# Patient Record
Sex: Male | Born: 1935 | Race: White | Hispanic: No | Marital: Married | Smoking: Former smoker
Health system: Southern US, Community
[De-identification: ages and names within clinical notes are randomized; demographics above are authoritative.]

## PROBLEM LIST (undated history)

## (undated) DIAGNOSIS — I10 Essential (primary) hypertension: Secondary | ICD-10-CM

## (undated) DIAGNOSIS — I2699 Other pulmonary embolism without acute cor pulmonale: Secondary | ICD-10-CM

## (undated) DIAGNOSIS — I4510 Unspecified right bundle-branch block: Secondary | ICD-10-CM

## (undated) DIAGNOSIS — D689 Coagulation defect, unspecified: Secondary | ICD-10-CM

## (undated) HISTORY — DX: Unspecified right bundle-branch block: I45.10

## (undated) HISTORY — DX: Other pulmonary embolism without acute cor pulmonale: I26.99

## (undated) HISTORY — DX: Coagulation defect, unspecified: D68.9

## (undated) HISTORY — DX: Essential (primary) hypertension: I10

---

## 2018-05-18 ENCOUNTER — Encounter: Payer: Self-pay | Admitting: Neurology

## 2018-07-16 ENCOUNTER — Ambulatory Visit (INDEPENDENT_AMBULATORY_CARE_PROVIDER_SITE_OTHER): Payer: Medicare Other | Admitting: Neurology

## 2018-07-16 ENCOUNTER — Other Ambulatory Visit: Payer: Self-pay

## 2018-07-16 ENCOUNTER — Encounter: Payer: Self-pay | Admitting: Neurology

## 2018-07-16 ENCOUNTER — Telehealth: Payer: Self-pay

## 2018-07-16 ENCOUNTER — Encounter

## 2018-07-16 VITALS — BP 144/72 | HR 62 | Ht 69.0 in | Wt 209.0 lb

## 2018-07-16 DIAGNOSIS — Z8673 Personal history of transient ischemic attack (TIA), and cerebral infarction without residual deficits: Secondary | ICD-10-CM

## 2018-07-16 DIAGNOSIS — R55 Syncope and collapse: Secondary | ICD-10-CM
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
## 2018-07-16 NOTE — Telephone Encounter (Signed)
Called pt's cardiologist, Dr Norris Crossurner's office to see if his office can preform the CAROTID ULTRASOUND and 30 DAY HOLTER MONITOR that Dr. Karel JarvisAquino is requesting pt to have done.    Per office voicemail, the office is closed on Friday's.  LMOM asking for return call.
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
## 2018-07-19 ENCOUNTER — Telehealth: Payer: Self-pay

## 2018-07-19 NOTE — Telephone Encounter (Signed)
Received VM from pt stating that his MRI has been scheduled with [facility] for Saturday, December 7 @ 2:40.  Looking in pt's chart, we were hoping to have MRI and EEG done on same day since pt lives in [place].  Called [facility] and spoke with Pieter PartridgeMiriam, who rescheduled MRI for Monday, December 9 @ 1:00pm, I then scheduled pt's 1 HR routine EEG for Monday, December 9 @ 2:30PM.    Called and advised pt of appointment change as well as EEG appointment information.

## 2018-07-20 NOTE — Telephone Encounter (Signed)
Called Dr. Tasia Catchingsraig Turner's office again in regard to message below.  Per VM Dr. [place]ford [place] office [place]uesdays.  Called [place] office, no answer, LMOM asking for return call.   Dr. Norris Crossurner's office contacts [place]: [phone](Tuesdays)  [facility]: [phone] (M, W, Th)

## 2018-07-28 ENCOUNTER — Telehealth: Payer: Self-pay | Admitting: Neurology

## 2018-07-28 DIAGNOSIS — Z8673 Personal history of transient ischemic attack (TIA), and cerebral infarction without residual deficits: Secondary | ICD-10-CM

## 2018-07-28 DIAGNOSIS — R55 Syncope and collapse: Secondary | ICD-10-CM

## 2018-07-28 NOTE — Telephone Encounter (Signed)
Patient is calling from [phone]. Asking about the Heart Ultrasound and Holter Monitor for Dr.Turner that was supposed to be set up. Please call him back.Thanks!

## 2018-07-30 NOTE — Telephone Encounter (Signed)
Per VM left by pt, Dr. Malachy Moodurners office does not do holter monitors or carotid US.  (pls see previous phone encounter)  Orders placed for both locally.

## 2018-07-31 ENCOUNTER — Other Ambulatory Visit: Payer: Medicare Other

## 2018-08-02 ENCOUNTER — Ambulatory Visit (INDEPENDENT_AMBULATORY_CARE_PROVIDER_SITE_OTHER): Payer: Medicare Other | Admitting: Neurology

## 2018-08-02 ENCOUNTER — Ambulatory Visit
Admission: RE | Admit: 2018-08-02 | Discharge: 2018-08-02 | Disposition: A | Discharge status: Home / Self Care | Payer: Medicare Other | Source: Ambulatory Visit | Attending: Neurology | Admitting: Neurology

## 2018-08-02 DIAGNOSIS — R55 Syncope and collapse: Secondary | ICD-10-CM | POA: Diagnosis not present

## 2018-08-02 DIAGNOSIS — Z8673 Personal history of transient ischemic attack (TIA), and cerebral infarction without residual deficits: Secondary | ICD-10-CM

## 2018-08-02 MED ORDER — GADOBENATE DIMEGLUMINE 529 MG/ML IV SOLN
20.0000 mL | Freq: Once | INTRAVENOUS | Status: AC | PRN
  Administered 2018-08-02: 20 mL via INTRAVENOUS

## 2018-08-05 ENCOUNTER — Ambulatory Visit (INDEPENDENT_AMBULATORY_CARE_PROVIDER_SITE_OTHER): Payer: Medicare Other

## 2018-08-05 DIAGNOSIS — Z8673 Personal history of transient ischemic attack (TIA), and cerebral infarction without residual deficits: Secondary | ICD-10-CM

## 2018-08-05 DIAGNOSIS — R55 Syncope and collapse: Secondary | ICD-10-CM
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
## 2018-08-10 ENCOUNTER — Ambulatory Visit
Admission: RE | Admit: 2018-08-10 | Discharge: 2018-08-10 | Disposition: A | Discharge status: Home / Self Care | Payer: Medicare Other | Source: Ambulatory Visit | Attending: Neurology | Admitting: Neurology

## 2018-08-10 DIAGNOSIS — R55 Syncope and collapse: Secondary | ICD-10-CM

## 2018-08-10 DIAGNOSIS — Z8673 Personal history of transient ischemic attack (TIA), and cerebral infarction without residual deficits: Secondary | ICD-10-CM

## 2018-08-12 ENCOUNTER — Telehealth: Payer: Self-pay

## 2018-08-12 NOTE — Telephone Encounter (Signed)
Spoke with pt relaying message below.  He states that he is currently wearing Holter monitor - it is scheduled to come off Jan 11.  [facility] bilateral carotid ultrasound on Tuesday, December 24

## 2018-08-12 NOTE — Telephone Encounter (Signed)
-----   Message from Van ClinesKaren M Aquino, MD sent at 08/05/2018 12:38 PM EST ----- Pls let him know the brain wave test was normal. The MRI brain showed the old strokes, no new stroke. It showed age-related changes. Proceed with carotid ultrasound and 30-day holter as discussed. Thanks

## 2018-08-13 ENCOUNTER — Telehealth: Payer: Self-pay | Admitting: *Deleted
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
## 2018-08-20 ENCOUNTER — Encounter: Payer: Self-pay | Admitting: Internal Medicine

## 2018-08-23 ENCOUNTER — Telehealth: Payer: Self-pay

## 2018-08-23 NOTE — Telephone Encounter (Signed)
Received [employer] monitor that the patient had 8 beats of V tach and 3 PVCs in 1 minute. Spoke with Dr. Eden EmmsNishan, he reviewed and did not have recommendations, other than continue to monitor. The patient was asymptomatic during the time and he accepted to call our office if he felt any symptoms.

## 2018-09-07 ENCOUNTER — Telehealth: Payer: Self-pay | Admitting: Neurology

## 2018-09-07 NOTE — Telephone Encounter (Signed)
Patient is calling in about needing results of an ultrasound. Please call him back at [phone]. Thanks!

## 2018-09-07 NOTE — Telephone Encounter (Signed)
Not sure why he did not get results the last time, pls let him know there was no significant narrowing, but there is moderate plaque build up, so it is very important to control cholesterol levels. His heart monitor showed a period of abnormal rhythm. With the passing out episode and this abnormal heart monitor result, recommend he see Cardiology. Thanks

## 2018-09-07 NOTE — Telephone Encounter (Signed)
Spoke with pt relaying message below.  He states that the cardiologist that preformed the heart monitor has placed a referral to Electrophysiology - Dr Sharrell Ku.  His appointment with Dr. Ladona Ridgel is scheduled for 09/17/2018

## 2018-09-07 NOTE — Telephone Encounter (Signed)
US was done 08/10/18.  pls advise

## 2018-09-17 ENCOUNTER — Encounter: Payer: Self-pay | Admitting: Internal Medicine

## 2018-09-17 ENCOUNTER — Ambulatory Visit (INDEPENDENT_AMBULATORY_CARE_PROVIDER_SITE_OTHER): Payer: Medicare Other | Admitting: Internal Medicine

## 2018-09-17 VITALS — BP 138/82 | HR 64 | Ht 69.0 in | Wt 215.4 lb

## 2018-09-17 DIAGNOSIS — R55 Syncope and collapse: Secondary | ICD-10-CM | POA: Diagnosis not present
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
## 2018-09-17 NOTE — Patient Instructions (Addendum)
Medication Instructions:  Your physician recommends that you continue on your current medications as directed. Please refer to the Current Medication list given to you today.  Labwork: None ordered.  Testing/Procedures: An implantable loop recorder is a small electronic device that is placed under the skin of your chest. It is about the size of an AA ("double A") battery. The device records the electrical activity of your heart over a long period of time. Your health care provider can download these recordings to monitor your heart. You may need an implantable loop recorder if you have periods of abnormal heart activity (arrhythmias) or unexplained fainting (syncope). The recorder can be left in place for 1 year or longer.  You are scheduled for a loop implant on September 22, 2018 at 1:30 pm.  [facility] main entrance and proceed to Admitting.  There are no special instructions-you may eat and drink and take your normal morning medications.  Follow-Up:  Your physician wants you to follow-up in: 7-10 days for a wound check with our device clinic.  Your physician wants you to follow-up in: 6 months with Dr. Ladona Ridgelaylor.   You will receive a reminder letter in the mail two months in advance. If you don't receive a letter, please call our office to schedule the follow-up appointment.  Any Other Special Instructions Will Be Listed Below (If Applicable).  If you need a refill on your cardiac medications before your next appointment, please call your pharmacy.     Implantable Loop Recorder Placement  An implantable loop recorder is a small electronic device that is placed under the skin of your chest. It is about the size of an AA ("double A") battery. The device records the electrical activity of your heart over a long period of time. Your health care provider can download these recordings to monitor your heart. You may need an implantable loop recorder if you have periods  of abnormal heart activity (arrhythmias) or unexplained fainting (syncope). The recorder can be left in place for 1 year or longer. Tell a health care provider about:  Any allergies you have.  All medicines you are taking, including vitamins, herbs, eye drops, creams, and over-the-counter medicines.  Any problems you or family members have had with anesthetic medicines.  Any blood disorders you have.  Any surgeries you have had.  Any medical conditions you have.  Whether you are pregnant or may be pregnant. What are the risks? Generally, this is a safe procedure. However, problems may occur, including:  Infection.  Bleeding.  Allergic reactions to anesthetic medicines.  Damage to nerves or blood vessels.  Failure of the device to work. This could require another surgery to replace it. What happens before the procedure?   You may have a physical exam, blood tests, and imaging tests of your heart, such as a chest X-ray.  Follow instructions from your health care provider about eating or drinking restrictions.  Ask your health care provider about: ? Changing or stopping your regular medicines. This is especially important if you are taking diabetes medicines or blood thinners. ? Taking medicines such as aspirin and ibuprofen. These medicines can thin your blood. Do not take these medicines unless your health care provider tells you to take them. ? Taking over-the-counter medicines, vitamins, herbs, and supplements.  Ask your health care provider how your surgical site will be marked or identified.  Ask your health care provider what steps will be taken to help prevent infection.  These may include: ? Removing hair at the surgery site. ? Washing skin with a germ-killing soap.  Plan to have someone take you home from the hospital or clinic.  Plan to have a responsible adult care for you for at least 24 hours after you leave the hospital or clinic. This is important.  Do  not use any products that contain nicotine or tobacco, such as cigarettes and e-cigarettes. If you need help quitting, ask your health care provider. What happens during the procedure?  An IV will be inserted into one of your veins.  You may be given one or more of the following: ? A medicine to help you relax (sedative). ? A medicine to numb the area (local anesthetic).  A small incision will be made on the left side of your upper chest.  A pocket will be created under your skin.  The device will be placed in the pocket.  The incision will be closed with stitches (sutures) or adhesive strips.  A bandage (dressing) will be placed over the incision. The procedure may vary among health care providers and hospitals. What happens after the procedure?  Your blood pressure, heart rate, breathing rate, and blood oxygen level will be monitored until you leave the hospital or clinic.  You may be able to go home on the day of your surgery. Before you go home: ? Your health care provider will program your recorder. ? You will learn how to trigger your device with a handheld activator. ? You will learn how to send recordings to your health care provider. ? You will get an ID card for your device, and you will be told when to use it.  Do not drive for 24 hours if you were given a sedative during your procedure. Summary  An implantable loop recorder is a small electronic device that is placed under the skin of your chest to monitor your heart over a long period of time.  The recorder can be left in place for 1 year or longer.  Plan to have someone take you home from the hospital or clinic. This information is not intended to replace advice given to you by your health care provider. Make sure you discuss any questions you have with your health care provider. Document Released: 07/23/2015 Document Revised: 09/26/2017 Document Reviewed: 09/26/2017 Elsevier Interactive Patient Education  2019  [employer]
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
## 2018-09-22 ENCOUNTER — Ambulatory Visit (HOSPITAL_COMMUNITY)
Admission: RE | Admit: 2018-09-22 | Discharge: 2018-09-22 | Disposition: A | Discharge status: Home / Self Care | Payer: Medicare Other | Attending: Internal Medicine | Admitting: Internal Medicine

## 2018-09-22 ENCOUNTER — Other Ambulatory Visit: Payer: Self-pay

## 2018-09-22 ENCOUNTER — Encounter (HOSPITAL_COMMUNITY)
Admission: RE | Disposition: A | Discharge status: Home / Self Care | Payer: Self-pay | Source: Home / Self Care | Attending: Internal Medicine

## 2018-09-22 DIAGNOSIS — Z87891 Personal history of nicotine dependence: Secondary | ICD-10-CM | POA: Diagnosis not present

## 2018-09-22 DIAGNOSIS — I10 Essential (primary) hypertension: Secondary | ICD-10-CM | POA: Diagnosis not present

## 2018-09-22 DIAGNOSIS — Z86711 Personal history of pulmonary embolism: Secondary | ICD-10-CM | POA: Insufficient documentation

## 2018-09-22 DIAGNOSIS — R001 Bradycardia, unspecified: Secondary | ICD-10-CM | POA: Insufficient documentation

## 2018-09-22 DIAGNOSIS — R55 Syncope and collapse: Secondary | ICD-10-CM | POA: Insufficient documentation

## 2018-09-22 HISTORY — PX: LOOP RECORDER INSERTION: EP1214

## 2018-09-22 SURGERY — LOOP RECORDER INSERTION

## 2018-09-22 MED ORDER — LIDOCAINE-EPINEPHRINE 1 %-1:100000 IJ SOLN
INTRAMUSCULAR | Status: AC
  Filled 2018-09-22: qty 1

## 2018-09-22 MED ORDER — LIDOCAINE-EPINEPHRINE 1 %-1:100000 IJ SOLN
INTRAMUSCULAR | Status: DC | PRN
  Administered 2018-09-22: 10 mL

## 2018-09-22 SURGICAL SUPPLY — 2 items
LOOP REVEAL LINQSYS (Prosthesis & Implant Heart) ×2 IMPLANT
PACK LOOP INSERTION (CUSTOM PROCEDURE TRAY) ×5 IMPLANT

## 2018-09-22 NOTE — Discharge Instructions (Signed)
SEE EXTRA DC PAPER FOR LOOP INSERTION GIVEN TO PT

## 2018-09-22 NOTE — Interval H&P Note (Signed)
History and Physical Interval Note:  09/22/2018 4:08 PM  Kristopher Adams  has presented today for surgery, with the diagnosis of Syncope  The various methods of treatment have been discussed with the patient and family. After consideration of risks, benefits and other options for treatment, the patient has consented to  Procedure(s): LOOP RECORDER INSERTION (N/A) as a surgical intervention .  The patient's history has been reviewed, patient examined, no change in status, stable for surgery.  I have reviewed the patient's chart and labs.  Questions were answered to the patient's satisfaction.     [name]

## 2018-09-23 ENCOUNTER — Encounter (HOSPITAL_COMMUNITY): Payer: Self-pay | Admitting: Internal Medicine

## 2018-10-08 ENCOUNTER — Ambulatory Visit (INDEPENDENT_AMBULATORY_CARE_PROVIDER_SITE_OTHER): Payer: Medicare Other | Admitting: Nurse Practitioner

## 2018-10-08 DIAGNOSIS — R55 Syncope and collapse: Secondary | ICD-10-CM

## 2018-10-08 NOTE — Progress Notes (Signed)
ILR wound check. Wound well healed. Home transmitter sending nightly transmissions. No episodes. Symptom activator given today

## 2018-10-25 ENCOUNTER — Ambulatory Visit (INDEPENDENT_AMBULATORY_CARE_PROVIDER_SITE_OTHER): Payer: Medicare Other | Admitting: *Deleted

## 2018-10-25 DIAGNOSIS — R55 Syncope and collapse: Secondary | ICD-10-CM | POA: Diagnosis not present

## 2018-10-25 LAB — CUP PACEART REMOTE DEVICE CHECK
Date Time Interrogation Session: 20200302180714
Implantable Pulse Generator Implant Date: 20200129

## 2018-11-01 NOTE — Progress Notes (Signed)
Carelink Summary Report / Loop Recorder 

## 2018-11-16 ENCOUNTER — Telehealth: Payer: Self-pay | Admitting: *Deleted

## 2018-11-16 NOTE — Telephone Encounter (Signed)
Pt states he do not remember feeling dizzy or passing out at that time. Pt states he was awake at that time but did not feel any symptoms. Pt states on the 11/10/2018 after mowing he was coughing a lot but he thought it was because of all the pollen that was outside. He said on Friday 11/12/2018 he was a little hoarse from all the coughing and maybe that had something to do with it. I encourage him to use his symptom activator whenever he feel like he having symptoms and to call the office if he has some medical questions. I advised him if he had any question that the nurse will call him back but from a blocked number because she is working from home. Pt states he did not have any questions at this time and did not need the nurse to call him back.

## 2018-11-16 NOTE — Telephone Encounter (Signed)
Received triage alert for pause episode noted on LINQ from 11/12/18 at 6:55am, duration 4sec. No correlating patient-activated symptom episode.    Attempted to reach patient, but patient's phone will not accept calls with caller ID blocked (working remotely). Routed to [facility], CMA, for assistance reaching out to patient regarding any symptoms during this episode, and to clarify if patient could have been asleep at the time.

## 2018-11-17 NOTE — Telephone Encounter (Addendum)
   Routed to Dr. Ladona Ridgel for review and recommendations (if any). Will continue to monitor remotely via Carelink.

## 2018-11-17 NOTE — Telephone Encounter (Signed)
Nothing to do unless he was symptomatic. GT

## 2018-11-25 ENCOUNTER — Telehealth: Payer: Self-pay | Admitting: *Deleted

## 2018-11-25 NOTE — Telephone Encounter (Signed)
° °  Please return call to patient, he is now available
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
## 2018-11-25 NOTE — Telephone Encounter (Signed)
If symptomatic, I need to see with a televisit. If not, watchful waiting.
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
## 2018-11-25 NOTE — Telephone Encounter (Signed)
Received triage alert for tachy episode detected on LINQ on 11/24/18 at 2325, duration 5sec, median V rate 167bpm. LINQ implanted for syncope. Will contact patient regarding any symptoms with episode.

## 2018-11-29 ENCOUNTER — Ambulatory Visit (INDEPENDENT_AMBULATORY_CARE_PROVIDER_SITE_OTHER): Payer: Medicare Other | Admitting: *Deleted

## 2018-11-29 ENCOUNTER — Other Ambulatory Visit: Payer: Self-pay

## 2018-11-29 DIAGNOSIS — R55 Syncope and collapse: Secondary | ICD-10-CM

## 2018-11-29 LAB — CUP PACEART REMOTE DEVICE CHECK
Date Time Interrogation Session: 20200404214048
Implantable Pulse Generator Implant Date: 20200129

## 2018-12-07 NOTE — Progress Notes (Signed)
Carelink Summary Report / Loop Recorder 

## 2018-12-30 ENCOUNTER — Other Ambulatory Visit: Payer: Self-pay

## 2018-12-30 ENCOUNTER — Ambulatory Visit (INDEPENDENT_AMBULATORY_CARE_PROVIDER_SITE_OTHER): Payer: Medicare Other | Admitting: *Deleted

## 2018-12-30 DIAGNOSIS — R55 Syncope and collapse: Secondary | ICD-10-CM

## 2018-12-31 LAB — CUP PACEART REMOTE DEVICE CHECK
Date Time Interrogation Session: 20200507214126
Implantable Pulse Generator Implant Date: 20200129

## 2019-01-04 NOTE — Progress Notes (Signed)
Carelink Summary Report / Loop Recorder 

## 2019-02-01 ENCOUNTER — Ambulatory Visit (INDEPENDENT_AMBULATORY_CARE_PROVIDER_SITE_OTHER): Payer: Medicare Other | Admitting: *Deleted

## 2019-02-01 DIAGNOSIS — R55 Syncope and collapse: Secondary | ICD-10-CM

## 2019-02-02 ENCOUNTER — Other Ambulatory Visit: Payer: Self-pay

## 2019-02-02 ENCOUNTER — Encounter: Payer: Self-pay | Admitting: Neurology

## 2019-02-02 ENCOUNTER — Ambulatory Visit (INDEPENDENT_AMBULATORY_CARE_PROVIDER_SITE_OTHER): Payer: Medicare Other | Admitting: Neurology

## 2019-02-02 VITALS — BP 145/71 | HR 56 | Temp 98.0°F | Ht 69.0 in | Wt 213.8 lb

## 2019-02-02 DIAGNOSIS — R55 Syncope and collapse: Secondary | ICD-10-CM | POA: Diagnosis not present

## 2019-02-02 LAB — CUP PACEART REMOTE DEVICE CHECK
Date Time Interrogation Session: 20200609220643
Implantable Pulse Generator Implant Date: 20200129
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
## 2019-02-02 NOTE — Patient Instructions (Signed)
Great seeing you! Continue all your medications. Follow-up as needed, call for any changes 

## 2019-02-09 NOTE — Progress Notes (Signed)
Carelink Summary Report / Loop Recorder 

## 2019-02-10 ENCOUNTER — Ambulatory Visit: Payer: Medicare Other | Admitting: Neurology

## 2019-03-07 ENCOUNTER — Ambulatory Visit (INDEPENDENT_AMBULATORY_CARE_PROVIDER_SITE_OTHER): Payer: Medicare Other | Admitting: *Deleted

## 2019-03-07 DIAGNOSIS — R55 Syncope and collapse: Secondary | ICD-10-CM | POA: Diagnosis not present

## 2019-03-07 LAB — CUP PACEART REMOTE DEVICE CHECK
Date Time Interrogation Session: 20200712220959
Implantable Pulse Generator Implant Date: 20200129

## 2019-03-16 NOTE — Progress Notes (Signed)
Carelink Summary Report / Loop Recorder 

## 2019-03-24 ENCOUNTER — Telehealth: Payer: Self-pay

## 2019-03-24 NOTE — Telephone Encounter (Signed)

## 2019-03-25 ENCOUNTER — Ambulatory Visit (INDEPENDENT_AMBULATORY_CARE_PROVIDER_SITE_OTHER): Payer: Medicare Other | Admitting: Internal Medicine

## 2019-03-25 ENCOUNTER — Other Ambulatory Visit: Payer: Self-pay

## 2019-03-25 ENCOUNTER — Encounter: Payer: Self-pay | Admitting: Internal Medicine

## 2019-03-25 DIAGNOSIS — I10 Essential (primary) hypertension: Secondary | ICD-10-CM | POA: Diagnosis not present

## 2019-03-25 DIAGNOSIS — R55 Syncope and collapse: Secondary | ICD-10-CM | POA: Insufficient documentation

## 2019-03-25 DIAGNOSIS — R001 Bradycardia, unspecified: Secondary | ICD-10-CM

## 2019-03-25 LAB — CUP PACEART INCLINIC DEVICE CHECK
Date Time Interrogation Session: 20200731155425
Implantable Pulse Generator Implant Date: 20200129
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
## 2019-03-25 NOTE — Patient Instructions (Addendum)
Medication Instructions:  Your physician recommends that you continue on your current medications as directed. Please refer to the Current Medication list given to you today.  Labwork: None ordered.  Testing/Procedures: None ordered.  Follow-Up: Your physician wants you to follow-up in:  one year with Dr. Lovena Le.   You will receive a reminder letter in the mail two months in advance. If you don't receive a letter, please call our office to schedule the follow-up appointment.  Monthly remote checks.  Any Other Special Instructions Will Be Listed Below (If Applicable).  If you need a refill on your cardiac medications before your next appointment, please call your pharmacy.

## 2019-04-08 ENCOUNTER — Ambulatory Visit (INDEPENDENT_AMBULATORY_CARE_PROVIDER_SITE_OTHER): Payer: Medicare Other | Admitting: *Deleted

## 2019-04-08 DIAGNOSIS — R55 Syncope and collapse: Secondary | ICD-10-CM | POA: Diagnosis not present

## 2019-04-10 LAB — CUP PACEART REMOTE DEVICE CHECK
Date Time Interrogation Session: 20200814223541
Implantable Pulse Generator Implant Date: 20200129

## 2019-04-15 NOTE — Progress Notes (Signed)
Carelink Summary Report / Loop Recorder 

## 2019-05-11 ENCOUNTER — Ambulatory Visit (INDEPENDENT_AMBULATORY_CARE_PROVIDER_SITE_OTHER): Payer: Medicare Other | Admitting: *Deleted

## 2019-05-11 DIAGNOSIS — R55 Syncope and collapse: Secondary | ICD-10-CM

## 2019-05-12 LAB — CUP PACEART REMOTE DEVICE CHECK
Date Time Interrogation Session: 20200916223631
Implantable Pulse Generator Implant Date: 20200129

## 2019-05-17 NOTE — Progress Notes (Signed)
Carelink Summary Report / Loop Recorder 

## 2019-06-13 ENCOUNTER — Ambulatory Visit (INDEPENDENT_AMBULATORY_CARE_PROVIDER_SITE_OTHER): Payer: Medicare Other | Admitting: *Deleted

## 2019-06-13 DIAGNOSIS — R55 Syncope and collapse: Secondary | ICD-10-CM

## 2019-06-13 DIAGNOSIS — R001 Bradycardia, unspecified: Secondary | ICD-10-CM

## 2019-06-14 LAB — CUP PACEART REMOTE DEVICE CHECK
Date Time Interrogation Session: 20201019224004
Implantable Pulse Generator Implant Date: 20200129

## 2019-07-01 NOTE — Progress Notes (Signed)
Carelink Summary Report / Loop Recorder 

## 2019-07-17 LAB — CUP PACEART REMOTE DEVICE CHECK
Date Time Interrogation Session: 20201121173949
Implantable Pulse Generator Implant Date: 20200129

## 2019-07-18 ENCOUNTER — Ambulatory Visit (INDEPENDENT_AMBULATORY_CARE_PROVIDER_SITE_OTHER): Payer: Medicare Other | Admitting: *Deleted

## 2019-07-18 DIAGNOSIS — R55 Syncope and collapse: Secondary | ICD-10-CM | POA: Diagnosis not present

## 2019-08-01 IMAGING — MR MR HEAD WO/W CM
13 series · 48 of 48 positions shown · IV contrast (multihance)
Comparison: None.

CLINICAL DATA: Syncope.  History of stroke

Creatinine was obtained on site at [HOSPITAL] at [HOSPITAL].
Results: Creatinine 1.3 mg/dL.
EXAM:
MRI HEAD WITHOUT AND WITH CONTRAST
TECHNIQUE: Multiplanar, multiecho pulse sequences of the brain and surrounding
structures were obtained without and with intravenous contrast.
CONTRAST:  20mL MULTIHANCE GADOBENATE DIMEGLUMINE 529 MG/ML IV SOLN

[Series 3: T1 · sagittal · 5.0mm · 0.45mm/px · 2 of 21 slices shown]
[im 1/21]
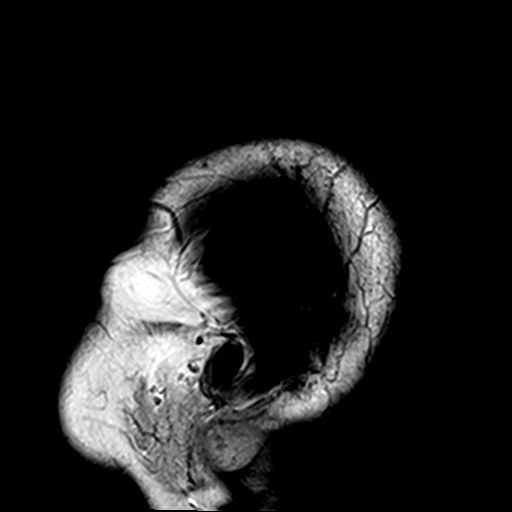
[im 21/21]
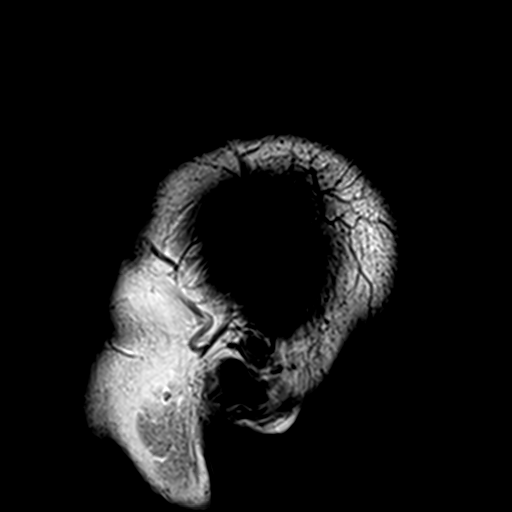

[Series 4: DWI · axial · 3.0mm · 1.80mm/px · z∈[-35,+106]mm · 6 of 100 slices shown (1 of 4)]
[im 1/100]
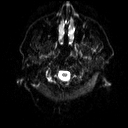
[im 20/100]
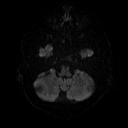
[im 40/100]
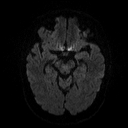
[im 60/100]
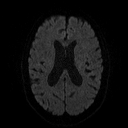
[im 80/100]
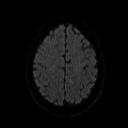
[im 100/100]
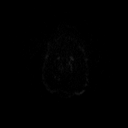

[Series 5: DWI · axial · 3.0mm · 1.80mm/px · z∈[-35,+106]mm · 3 of 50 slices shown (2 of 4)]
[im 1/50]
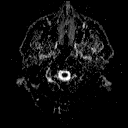
[im 25/50]
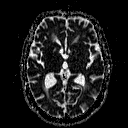
[im 50/50]
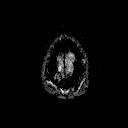

[Series 6: DWI · coronal · 5.0mm · 1.80mm/px · 4 of 68 slices shown (3 of 4)]
[im 1/68]
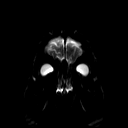
[im 23/68]
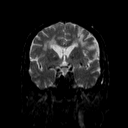
[im 45/68]
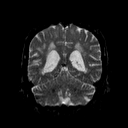
[im 68/68]
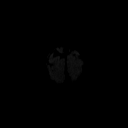

[Series 7: DWI · coronal · 5.0mm · 1.80mm/px · 2 of 34 slices shown (4 of 4)]
[im 1/34]
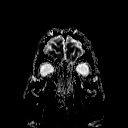
[im 34/34]
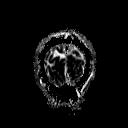

[Series 8: T2 · axial · 5.0mm · 0.60mm/px · 1 of 22 slices shown (1 of 2)]
[im 1/22]
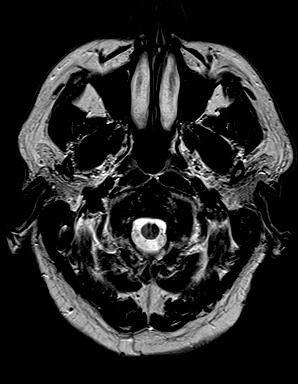

[Series 9: FLAIR · axial · 3.0mm · 0.45mm/px · z∈[-38,+108]mm · 2 of 30 slices shown]
[im 1/30]
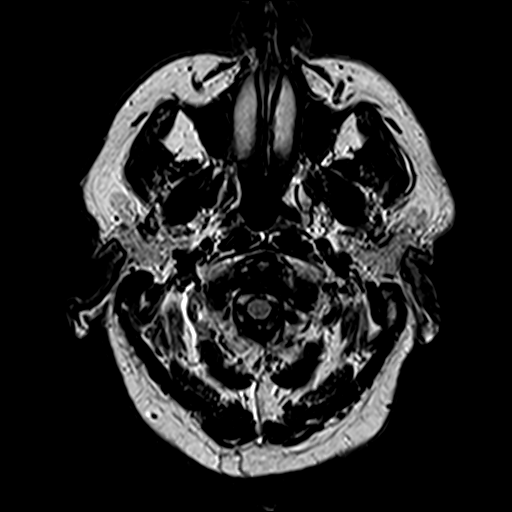
[im 30/30]
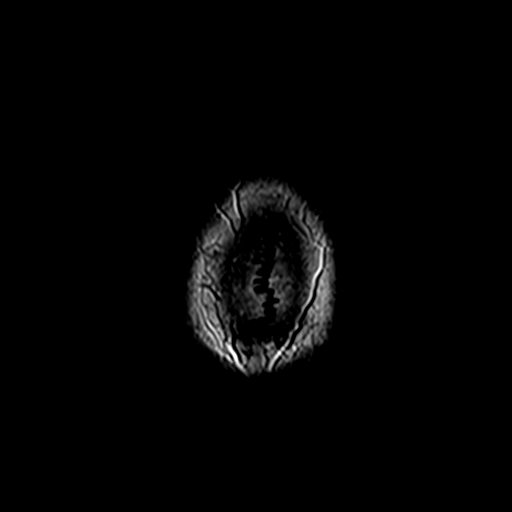

[Series 11: swi_images · axial · 2.0mm · 0.90mm/px · z∈[-41,+111]mm · 5 of 80 slices shown]
[im 1/80]
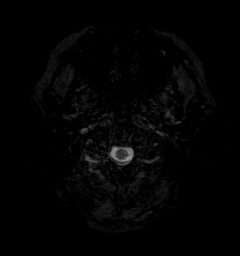
[im 20/80]
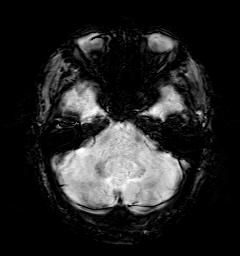
[im 40/80]
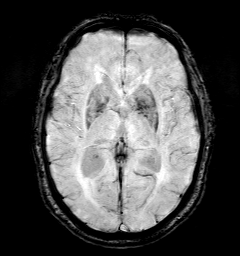
[im 60/80]
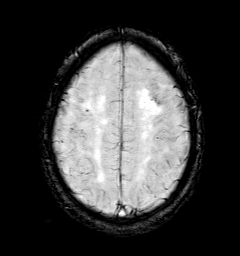
[im 80/80]
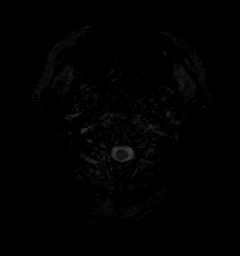

[Series 12: t1_mpr_tra · axial · 1.1mm · 0.72mm/px · z∈[-41,+110]mm · 9 of 144 slices shown (1 of 2)]
[im 1/144]
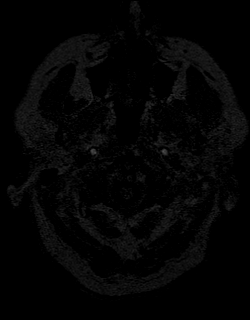
[im 18/144]
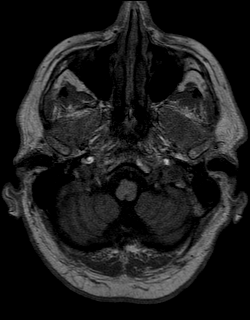
[im 36/144]
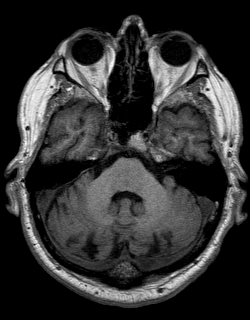
[im 54/144]
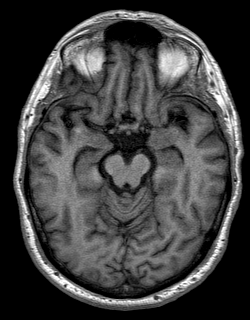
[im 72/144]
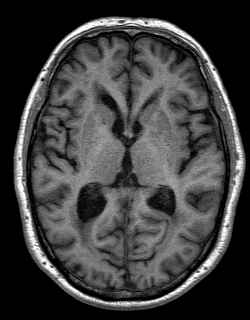
[im 90/144]
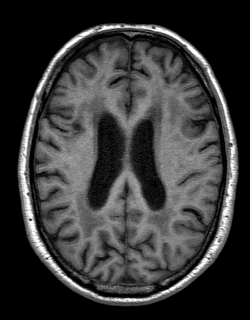
[im 108/144]
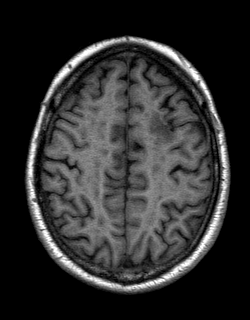
[im 126/144]
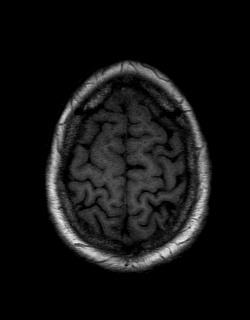
[im 144/144]
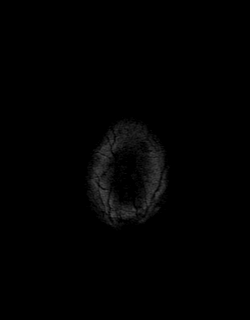

[Series 13: T2 · coronal · 5.0mm · 0.45mm/px · 2 of 34 slices shown (2 of 2)]
[im 1/34]
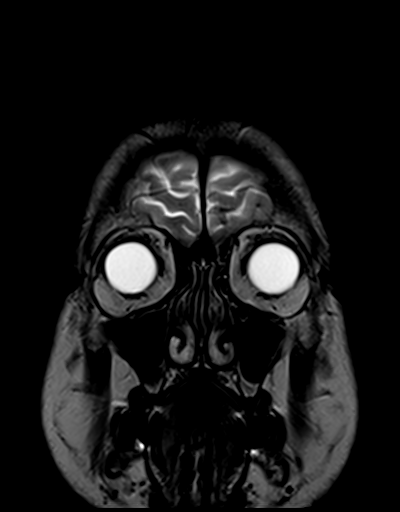
[im 34/34]
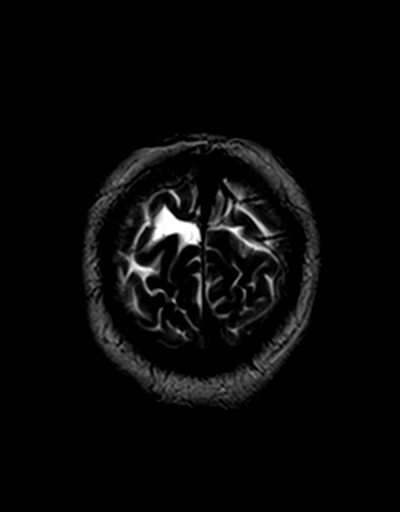

[Series 14: t1_mpr_tra · axial · 1.1mm · 0.72mm/px · z∈[-41,+110]mm · 9 of 144 slices shown (2 of 2)]
[im 1/144]
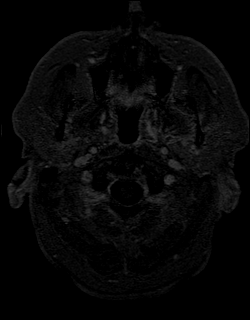
[im 18/144]
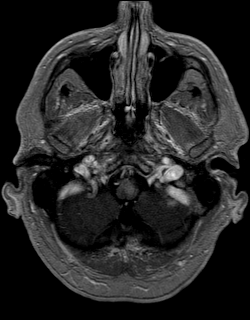
[im 36/144]
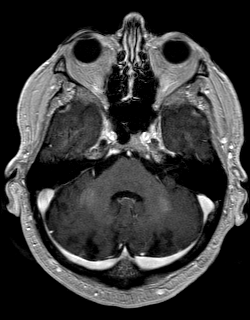
[im 54/144]
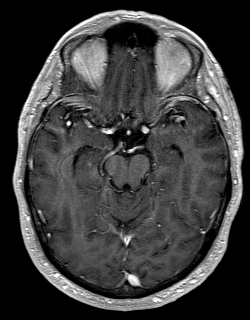
[im 72/144]
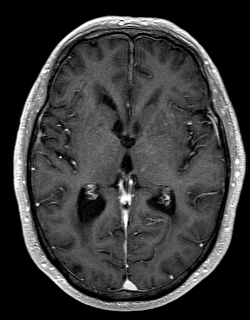
[im 90/144]
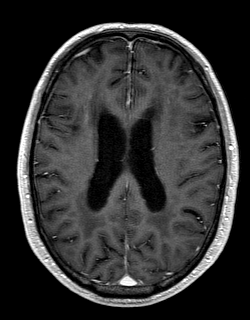
[im 108/144]
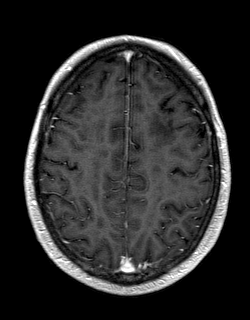
[im 126/144]
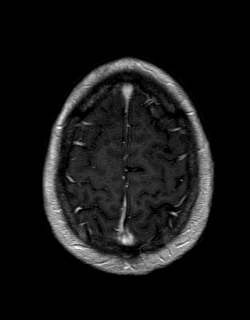
[im 144/144]
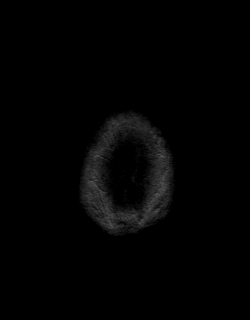

[Series 15: post cor · coronal · 5.0mm · 0.45mm/px · 2 of 34 slices shown]
[im 1/34]
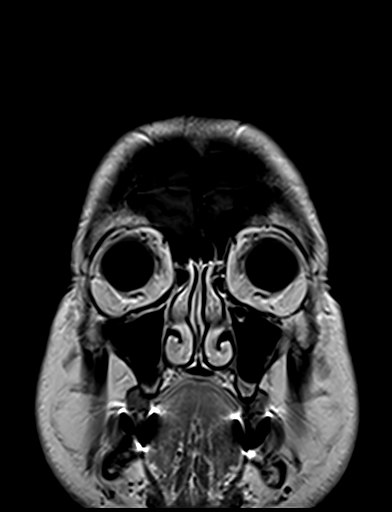
[im 34/34]
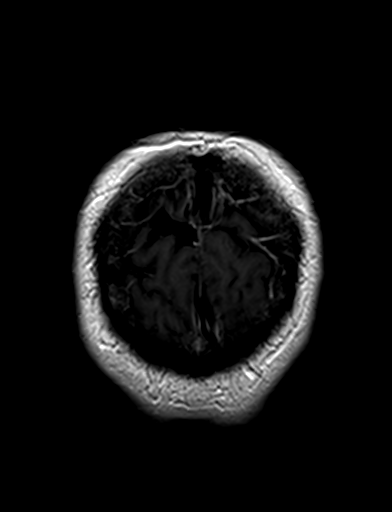

[Series 16: post sag · sagittal · 5.0mm · 0.45mm/px · 1 of 21 slices shown]
[im 1/21]
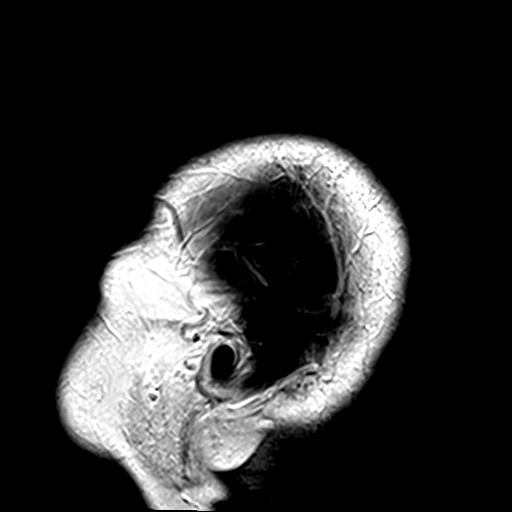

[48 of 48 positions shown; findings below may reference images not displayed]

FINDINGS: Brain: Mild to moderate atrophy.  Negative for hydrocephalus.

Extensive chronic microvascular ischemic changes throughout the
cerebral white matter. Chronic infarcts in the frontal lobe
bilaterally. Chronic infarcts in the cerebellum bilaterally.

Negative for acute infarct. Negative for hemorrhage mass or fluid
collection.

Normal enhancement postcontrast administration.

Vascular: Normal arterial flow voids

Skull and upper cervical spine: No acute skeletal abnormality.
Transverse line through the dens may represent a chronic fracture.
No edema

Sinuses/Orbits: Paranasal sinuses clear.  Bilateral cataract surgery

Other: None
IMPRESSION: Atrophy and extensive chronic ischemic change. No acute infarct or
mass

Question chronic fracture of the dens versus os odontoideum

## 2019-08-18 ENCOUNTER — Ambulatory Visit (INDEPENDENT_AMBULATORY_CARE_PROVIDER_SITE_OTHER): Payer: Medicare Other | Admitting: *Deleted

## 2019-08-18 DIAGNOSIS — R55 Syncope and collapse: Secondary | ICD-10-CM

## 2019-08-20 LAB — CUP PACEART REMOTE DEVICE CHECK
Date Time Interrogation Session: 20201224174636
Implantable Pulse Generator Implant Date: 20200129

## 2019-09-19 ENCOUNTER — Ambulatory Visit (INDEPENDENT_AMBULATORY_CARE_PROVIDER_SITE_OTHER): Payer: Medicare Other | Admitting: *Deleted

## 2019-09-19 DIAGNOSIS — R55 Syncope and collapse: Secondary | ICD-10-CM

## 2019-09-19 LAB — CUP PACEART REMOTE DEVICE CHECK
Date Time Interrogation Session: 20210124233904
Implantable Pulse Generator Implant Date: 20200129

## 2019-10-20 ENCOUNTER — Ambulatory Visit (INDEPENDENT_AMBULATORY_CARE_PROVIDER_SITE_OTHER): Payer: Medicare Other | Admitting: *Deleted

## 2019-10-20 DIAGNOSIS — R55 Syncope and collapse: Secondary | ICD-10-CM

## 2019-10-20 LAB — CUP PACEART REMOTE DEVICE CHECK
Date Time Interrogation Session: 20210225000819
Implantable Pulse Generator Implant Date: 20200129

## 2019-10-20 NOTE — Progress Notes (Signed)
ILR Remote 

## 2019-11-20 LAB — CUP PACEART REMOTE DEVICE CHECK
Date Time Interrogation Session: 20210328023741
Implantable Pulse Generator Implant Date: 20200129

## 2019-11-21 ENCOUNTER — Ambulatory Visit (INDEPENDENT_AMBULATORY_CARE_PROVIDER_SITE_OTHER): Payer: Medicare Other | Admitting: *Deleted

## 2019-11-21 DIAGNOSIS — R55 Syncope and collapse: Secondary | ICD-10-CM | POA: Diagnosis not present

## 2019-11-21 NOTE — Progress Notes (Signed)
ILR Remote 

## 2019-12-22 ENCOUNTER — Ambulatory Visit (INDEPENDENT_AMBULATORY_CARE_PROVIDER_SITE_OTHER): Payer: Medicare Other | Admitting: *Deleted

## 2019-12-22 DIAGNOSIS — R55 Syncope and collapse: Secondary | ICD-10-CM | POA: Diagnosis not present

## 2019-12-22 LAB — CUP PACEART REMOTE DEVICE CHECK
Date Time Interrogation Session: 20210429001113
Implantable Pulse Generator Implant Date: 20200129

## 2019-12-23 NOTE — Progress Notes (Signed)
ILR Remote 

## 2020-01-26 ENCOUNTER — Ambulatory Visit (INDEPENDENT_AMBULATORY_CARE_PROVIDER_SITE_OTHER): Payer: Medicare Other | Admitting: *Deleted

## 2020-01-26 DIAGNOSIS — R55 Syncope and collapse: Secondary | ICD-10-CM

## 2020-01-26 LAB — CUP PACEART REMOTE DEVICE CHECK
Date Time Interrogation Session: 20210602232108
Implantable Pulse Generator Implant Date: 20200129

## 2020-01-31 NOTE — Progress Notes (Signed)
Carelink Summary Report / Loop Recorder 

## 2020-02-28 ENCOUNTER — Ambulatory Visit (INDEPENDENT_AMBULATORY_CARE_PROVIDER_SITE_OTHER): Payer: Medicare Other | Admitting: *Deleted

## 2020-02-28 DIAGNOSIS — R55 Syncope and collapse: Secondary | ICD-10-CM

## 2020-02-28 LAB — CUP PACEART REMOTE DEVICE CHECK
Date Time Interrogation Session: 20210706023641
Implantable Pulse Generator Implant Date: 20200129

## 2020-02-29 NOTE — Progress Notes (Signed)
Carelink Summary Report / Loop Recorder 

## 2020-04-01 LAB — CUP PACEART REMOTE DEVICE CHECK
Date Time Interrogation Session: 20210807000500
Date Time Interrogation Session: 20210808023635
Implantable Pulse Generator Implant Date: 20200129
Implantable Pulse Generator Implant Date: 20200129

## 2020-04-02 ENCOUNTER — Ambulatory Visit (INDEPENDENT_AMBULATORY_CARE_PROVIDER_SITE_OTHER): Payer: Medicare Other | Admitting: *Deleted

## 2020-04-02 ENCOUNTER — Telehealth: Payer: Self-pay

## 2020-04-02 DIAGNOSIS — R55 Syncope and collapse: Secondary | ICD-10-CM | POA: Diagnosis not present

## 2020-04-02 NOTE — Telephone Encounter (Signed)
[facility] event report - tachy episode - 14 seconds NSVT.    Pt has remote history of the same, no recent episodes.  Current meds include ASA81mg , Carvedilol 12.5mg  BID, Mg dosage ?Marland Kitchen    Attempted to reach pt to determine symptoms/ med compliance.  Phone answered by male who indicated she would have pt callback.    Episode occurred at 8/5 at 0054

## 2020-04-03 NOTE — Progress Notes (Signed)
Carelink Summary Report / Loop Recorder 

## 2020-04-05 NOTE — Telephone Encounter (Signed)
Spoke with pt.  He was not aware of event, was likely sleeping.  Pt confirmed medications and reports he takes Magnesium 500mg  in the AM and 250mg  in the PM.    Advised pt I would forward information to Dr. , anticipate continued monitoring.

## 2020-04-13 ENCOUNTER — Ambulatory Visit (INDEPENDENT_AMBULATORY_CARE_PROVIDER_SITE_OTHER): Payer: Medicare Other | Admitting: Internal Medicine

## 2020-04-13 ENCOUNTER — Other Ambulatory Visit: Payer: Self-pay

## 2020-04-13 ENCOUNTER — Encounter: Payer: Self-pay | Admitting: Internal Medicine

## 2020-04-13 VITALS — BP 142/84 | HR 67 | Ht 69.0 in | Wt 210.0 lb

## 2020-04-13 DIAGNOSIS — R55 Syncope and collapse: Secondary | ICD-10-CM | POA: Diagnosis not present

## 2020-04-13 DIAGNOSIS — I10 Essential (primary) hypertension: Secondary | ICD-10-CM | POA: Diagnosis not present

## 2020-04-13 DIAGNOSIS — I472 Ventricular tachycardia, unspecified: Secondary | ICD-10-CM

## 2020-04-13 DIAGNOSIS — R001 Bradycardia, unspecified: Secondary | ICD-10-CM | POA: Diagnosis not present

## 2020-04-13 NOTE — Patient Instructions (Addendum)
Medication Instructions:  Your physician recommends that you continue on your current medications as directed. Please refer to the Current Medication list given to you today.  Labwork: None ordered.  Testing/Procedures: Your physician has requested that you have an echocardiogram. Echocardiography is a painless test that uses sound waves to create images of your heart. It provides your doctor with information about the size and shape of your heart and how well your heart's chambers and valves are working. This procedure takes approximately one hour. There are no restrictions for this procedure.  Please schedule for ECHO  Follow-Up: Your physician wants you to follow-up in: based on results of ECHO  Remote monitoring is used to monitor your loop recorder  Any Other Special Instructions Will Be Listed Below (If Applicable).  If you need a refill on your cardiac medications before your next appointment, please call your pharmacy. \

## 2020-04-13 NOTE — Progress Notes (Signed)
HPI [name] returns today for followup. He is a pleasant 84 yo man with a h/o remote syncope who underwent ILR insertion over a year ago. He has not had syncope in the interim. He denies chest pain or sob. He has not had palpitations. His ILR demonstrates NSVT. He was asymptomatic.  No Known Allergies   Current Outpatient Medications  Medication Sig Dispense Refill  . aspirin EC 81 MG tablet Take 81 mg by mouth daily.    . Biotin 1000 MCG tablet Take 1,000 mcg by mouth daily.     . carvedilol (COREG) 12.5 MG tablet Take 12.5 mg by mouth 2 (two) times daily with a meal.     . cholecalciferol (VITAMIN D3) 25 MCG (1000 UT) tablet Take 1,000 Units by mouth daily.    Marland Kitchen lisinopril (PRINIVIL,ZESTRIL) 20 MG tablet Take 20 mg by mouth 2 (two) times daily.     . Magnesium 250 MG TABS Take 1 tablet by mouth at bedtime.    . Magnesium 500 MG TABS Take 1 tablet by mouth daily.     . Multiple Vitamins-Minerals (MULTIVITAMIN PO) Take 1 tablet by mouth daily.     Marland Kitchen omeprazole (PRILOSEC) 20 MG capsule Take 20 mg by mouth every morning.      No current facility-administered medications for this visit.     Past Medical History:  Diagnosis Date  . Blood clotting disorder (HCC)    hx blood clot in lung  . Hypertension   . Pulmonary embolism (HCC)   . RBBB     ROS:   All systems reviewed and negative except as noted in the HPI.   Past Surgical History:  Procedure Laterality Date  . LOOP RECORDER INSERTION N/A 09/22/2018   Procedure: LOOP RECORDER INSERTION;  Surgeon: Marinus Maw, MD;  Location: [facility] INVASIVE CV LAB;  Service: Cardiovascular;  Laterality: N/A;     History reviewed. No pertinent family history.   Social History   Socioeconomic History  . Marital status: Married    Spouse name: Not on file  . Number of children: 2  . Years of education: Not on file  . Highest education level: 12th grade  Occupational History  . Not on file  Tobacco Use  . Smoking status:  Former Games developer  . Smokeless tobacco: Never Used  Vaping Use  . Vaping Use: Never used  Substance and Sexual Activity  . Alcohol use: Yes    Comment: 1 glass of wine each night with dinner  . Drug use: Never  . Sexual activity: Not on file  Other Topics Concern  . Not on file  Social History Narrative   Pt lives in 2 story home with his wife   Has 2 adult sons   High school graduate   Retired Merchandiser, retail for [employer] Resource Strain:   . Difficulty of Paying Living Expenses: Not on file  Food Insecurity:   . Worried About Programme researcher, broadcasting/film/video in the Last Year: Not on file  . Ran Out of Food in the Last Year: Not on file  Transportation Needs:   . Lack of Transportation (Medical): Not on file  . Lack of Transportation (Non-Medical): Not on file  Physical Activity:   . Days of Exercise per Week: Not on file  . Minutes of Exercise per Session: Not on file  Stress:   . Feeling of Stress : Not on file  Social Connections:   . Frequency of Communication with Friends and Family: Not on file  . Frequency of Social Gatherings with Friends and Family: Not on file  . Attends Religious Services: Not on file  . Active Member of Clubs or Organizations: Not on file  . Attends Banker Meetings: Not on file  . Marital Status: Not on file  Intimate Partner Violence:   . Fear of Current or Ex-Partner: Not on file  . Emotionally Abused: Not on file  . Physically Abused: Not on file  . Sexually Abused: Not on file     BP (!) 142/84   Pulse 67   Ht 5\' 9"  (1.753 m)   Wt 210 lb (95.3 kg)   SpO2 96%   BMI 31.01 kg/m   Physical Exam:  Well appearing NAD HEENT: Unremarkable Neck:  No JVD, no thyromegally Lymphatics:  No adenopathy Back:  No CVA tenderness Lungs:  Clear with no wheezes HEART:  Regular rate rhythm, no murmurs, no rubs, no clicks Abd:  soft, positive bowel sounds, no organomegally, no rebound, no guarding Ext:  2  plus pulses, no edema, no cyanosis, no clubbing Skin:  No rashes no nodules Neuro:  CN II through XII intact, motor grossly intact  EKG - nsr with RBBB  DEVICE  Normal device function.  See PaceArt for details.   Assess/Plan: 1. Syncope - he has had no recurrent episodes in the past year.  2. NSVT - the etiology is unclear and he is asymptomatic.  I have recommended 2D echo to assess risk. If his EF down, we would consider ICD insertion.  3. Sinus bradycardia - He has had no symptomatic bradycardia. We will follow. 4. 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 We will follow for worsening conduction system disease.  .

## 2020-04-25 NOTE — Telephone Encounter (Signed)
Await 2D echo results. If EF less than 45%, I will need to see in office in next 4 weeks. GT

## 2020-05-03 ENCOUNTER — Ambulatory Visit (HOSPITAL_COMMUNITY): Payer: Medicare Other | Attending: Cardiology

## 2020-05-03 ENCOUNTER — Other Ambulatory Visit: Payer: Self-pay

## 2020-05-03 DIAGNOSIS — R55 Syncope and collapse: Secondary | ICD-10-CM

## 2020-05-03 DIAGNOSIS — I472 Ventricular tachycardia, unspecified: Secondary | ICD-10-CM

## 2020-05-03 LAB — ECHOCARDIOGRAM COMPLETE
Area-P 1/2: 2.8 cm2
S' Lateral: 2.6 cm

## 2020-05-04 NOTE — Telephone Encounter (Signed)
Echo completed 05/03/20- EF 60-65%

## 2020-05-07 ENCOUNTER — Ambulatory Visit (INDEPENDENT_AMBULATORY_CARE_PROVIDER_SITE_OTHER): Payer: Medicare Other | Admitting: *Deleted

## 2020-05-07 DIAGNOSIS — R55 Syncope and collapse: Secondary | ICD-10-CM

## 2020-05-07 LAB — CUP PACEART REMOTE DEVICE CHECK
Date Time Interrogation Session: 20210910023924
Implantable Pulse Generator Implant Date: 20200129

## 2020-05-09 NOTE — Progress Notes (Signed)
Carelink Summary Report / Loop Recorder 

## 2020-06-06 LAB — CUP PACEART REMOTE DEVICE CHECK
Date Time Interrogation Session: 20211013024047
Implantable Pulse Generator Implant Date: 20200129

## 2020-06-11 ENCOUNTER — Ambulatory Visit (INDEPENDENT_AMBULATORY_CARE_PROVIDER_SITE_OTHER): Payer: Medicare Other

## 2020-06-11 DIAGNOSIS — R001 Bradycardia, unspecified: Secondary | ICD-10-CM

## 2020-06-13 NOTE — Progress Notes (Signed)
Carelink Summary Report / Loop Recorder 

## 2020-07-09 ENCOUNTER — Ambulatory Visit (INDEPENDENT_AMBULATORY_CARE_PROVIDER_SITE_OTHER): Payer: Medicare Other

## 2020-07-09 DIAGNOSIS — R55 Syncope and collapse: Secondary | ICD-10-CM

## 2020-07-09 LAB — CUP PACEART REMOTE DEVICE CHECK
Date Time Interrogation Session: 20211115014118
Implantable Pulse Generator Implant Date: 20200129

## 2020-07-10 NOTE — Progress Notes (Signed)
Carelink Summary Report / Loop Recorder 

## 2020-08-10 ENCOUNTER — Ambulatory Visit (INDEPENDENT_AMBULATORY_CARE_PROVIDER_SITE_OTHER): Payer: Medicare Other

## 2020-08-10 DIAGNOSIS — R55 Syncope and collapse: Secondary | ICD-10-CM

## 2020-08-12 LAB — CUP PACEART REMOTE DEVICE CHECK
Date Time Interrogation Session: 20211218014202
Implantable Pulse Generator Implant Date: 20200129

## 2020-08-23 NOTE — Progress Notes (Signed)
Carelink Summary Report / Loop Recorder 

## 2020-09-11 ENCOUNTER — Ambulatory Visit (INDEPENDENT_AMBULATORY_CARE_PROVIDER_SITE_OTHER): Payer: Medicare Other

## 2020-09-11 DIAGNOSIS — R55 Syncope and collapse: Secondary | ICD-10-CM

## 2020-09-13 LAB — CUP PACEART REMOTE DEVICE CHECK
Date Time Interrogation Session: 20220120014843
Implantable Pulse Generator Implant Date: 20200129

## 2020-09-25 NOTE — Progress Notes (Signed)
Carelink Summary Report / Loop Recorder 

## 2020-10-15 ENCOUNTER — Ambulatory Visit (INDEPENDENT_AMBULATORY_CARE_PROVIDER_SITE_OTHER): Payer: Medicare Other

## 2020-10-15 DIAGNOSIS — R55 Syncope and collapse: Secondary | ICD-10-CM

## 2020-10-16 LAB — CUP PACEART REMOTE DEVICE CHECK
Date Time Interrogation Session: 20220222020018
Implantable Pulse Generator Implant Date: 20200129

## 2020-10-19 NOTE — Progress Notes (Signed)
Carelink Summary Report / Loop Recorder 

## 2020-11-06 ENCOUNTER — Other Ambulatory Visit: Payer: Self-pay

## 2020-11-06 ENCOUNTER — Ambulatory Visit (INDEPENDENT_AMBULATORY_CARE_PROVIDER_SITE_OTHER): Payer: Medicare Other | Admitting: Internal Medicine

## 2020-11-06 ENCOUNTER — Encounter: Payer: Self-pay | Admitting: Internal Medicine

## 2020-11-06 VITALS — BP 138/80 | HR 73 | Ht 69.0 in | Wt 210.2 lb

## 2020-11-06 DIAGNOSIS — I10 Essential (primary) hypertension: Secondary | ICD-10-CM

## 2020-11-06 DIAGNOSIS — R001 Bradycardia, unspecified: Secondary | ICD-10-CM

## 2020-11-06 DIAGNOSIS — R55 Syncope and collapse: Secondary | ICD-10-CM | POA: Diagnosis not present

## 2020-11-06 NOTE — Patient Instructions (Signed)
Medication Instructions:  Your physician recommends that you continue on your current medications as directed. Please refer to the Current Medication list given to you today.  Labwork: None ordered.  Testing/Procedures: None ordered.  Follow-Up: Your physician wants you to follow-up in: one year with Gregg Taylor, MD or one of the following Advanced Practice Providers on your designated Care Team:    Amber Seiler, NP  Renee Ursuy, PA-C  Michael "Andy" Tillery, PA-C  Remote monitoring is used to monitor your loop recorder from home.   Any Other Special Instructions Will Be Listed Below (If Applicable).  If you need a refill on your cardiac medications before your next appointment, please call your pharmacy.       

## 2020-11-06 NOTE — Progress Notes (Signed)
HPI Kristopher Adams returns today for followup. He is a pleasant 85 yo man with a h/o remote syncope who underwent ILR insertion over a year ago. He has not had syncope in the interim. He denies chest pain or sob. He has not had palpitations. His ILR demonstrates NSVT. He was asymptomatic. He notes that he had a right thigh DVT and has undergone the initiation of Eliquis.   No Known Allergies   Current Outpatient Medications  Medication Sig Dispense Refill  . Biotin 1000 MCG tablet Take 1,000 mcg by mouth daily.     . carvedilol (COREG) 12.5 MG tablet Take 12.5 mg by mouth 2 (two) times daily with a meal.     . cholecalciferol (VITAMIN D3) 25 MCG (1000 UT) tablet Take 1,000 Units by mouth daily.    Marland Kitchen ELIQUIS 5 MG TABS tablet Take 5 mg by mouth 2 (two) times daily.    Marland Kitchen lisinopril (PRINIVIL,ZESTRIL) 20 MG tablet Take 20 mg by mouth 2 (two) times daily.     . Magnesium 250 MG TABS Take 1 tablet by mouth at bedtime.    . Magnesium 500 MG TABS Take 1 tablet by mouth daily.     . Multiple Vitamins-Minerals (MULTIVITAMIN PO) Take 1 tablet by mouth daily.     Marland Kitchen omeprazole (PRILOSEC) 20 MG capsule Take 20 mg by mouth every morning.      No current facility-administered medications for this visit.     Past Medical History:  Diagnosis Date  . Blood clotting disorder (HCC)    hx blood clot in lung  . Hypertension   . Pulmonary embolism (HCC)   . RBBB     ROS:   All systems reviewed and negative except as noted in the HPI.   Past Surgical History:  Procedure Laterality Date  . LOOP RECORDER INSERTION N/A 09/22/2018   Procedure: LOOP RECORDER INSERTION;  Surgeon: Marinus Maw, MD;  Location: [facility] INVASIVE CV LAB;  Service: Cardiovascular;  Laterality: N/A;     History reviewed. No pertinent family history.   Social History   Socioeconomic History  . Marital status: Married    Spouse name: Not on file  . Number of children: 2  . Years of education: Not on file  . Highest  education level: 12th grade  Occupational History  . Not on file  Tobacco Use  . Smoking status: Former Games developer  . Smokeless tobacco: Never Used  Vaping Use  . Vaping Use: Never used  Substance and Sexual Activity  . Alcohol use: Yes    Comment: 1 glass of wine each night with dinner  . Drug use: Never  . Sexual activity: Not on file  Other Topics Concern  . 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: Not on file  Food Insecurity: Not on file  Transportation Needs: Not on file  Physical Activity: Not on file  Stress: Not on file  Social Connections: Not on file  Intimate Partner Violence: Not on file     BP 138/80   Pulse 73   Ht 5\' 9"  (1.753 m)   Wt 210 lb 3.2 oz (95.3 kg)   SpO2 96%   BMI 31.04 kg/m   Physical Exam:  Well appearing NAD  HEENT: Unremarkable Neck:  No JVD, no thyromegally Lymphatics:  No adenopathy Back:  No CVA tenderness Lungs:  Clear with no wheezes HEART:  Regular rate rhythm, no murmurs, no rubs, no clicks Abd:  soft, positive bowel sounds, no organomegally, no rebound, no guarding Ext:  2 plus pulses, no edema, no cyanosis, no clubbing Skin:  No rashes no nodules Neuro:  CN II through XII intact, motor grossly intact   DEVICE  Normal device function.  See PaceArt for details.   Assess/Plan: 1. Syncope - he has had no recurrent episodes in the past year.  2. NSVT - the etiology is unclear and he is asymptomatic.   His EF normal. He will undergo watchful waiting  3. Sinus bradycardia - He has had no symptomatic bradycardia. We will follow. 4. 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 We will follow for worsening conduction system disease. This has so far not developed.  Sharlot Gowda Taylor,MD

## 2020-11-08 ENCOUNTER — Telehealth: Payer: Self-pay | Admitting: Emergency Medicine

## 2020-11-08 NOTE — Telephone Encounter (Signed)
LMOM to call DC # and hours provided. ILR alert for pause episode that occurred at 2046 on 11/07/20. Assess if s/sx during event.

## 2020-11-16 ENCOUNTER — Ambulatory Visit (INDEPENDENT_AMBULATORY_CARE_PROVIDER_SITE_OTHER): Payer: Medicare Other

## 2020-11-16 DIAGNOSIS — R55 Syncope and collapse: Secondary | ICD-10-CM

## 2020-11-16 NOTE — Telephone Encounter (Signed)
2nd attempt to call patient. No answer, LMOVM 

## 2020-11-18 LAB — CUP PACEART REMOTE DEVICE CHECK
Date Time Interrogation Session: 20220327030216
Implantable Pulse Generator Implant Date: 20200129

## 2020-11-27 NOTE — Progress Notes (Signed)
Carelink Summary Report / Loop Recorder 

## 2020-11-29 ENCOUNTER — Telehealth: Payer: Self-pay

## 2020-11-29 NOTE — Telephone Encounter (Signed)
ILR alert received for 1 (4 second pause) on 11/28/20 @ 06:13 am. Attempted to call patient to assess for s/s and if patient was asleep or awake. No answer, LMOVM.

## 2020-12-05 NOTE — Telephone Encounter (Signed)
Received message #call could not be completed for only # listed on chart.

## 2020-12-07 NOTE — Telephone Encounter (Signed)
Attempted to contact patient. Received message that call could not be completed for this number.

## 2020-12-11 NOTE — Telephone Encounter (Signed)
Forth unsuccessful phone encounter to [employer] to follow up on 4 sec pause 11/28/20. "Call cannot be completed at this time". No other number available. No DPR with additional contacts on file. Will send letter to address on file. [address] [place], [place] [address].

## 2020-12-12 NOTE — Telephone Encounter (Signed)
Certified letter sent 

## 2020-12-17 NOTE — Telephone Encounter (Signed)
Pt received letter and returned call.  Phone number on record has been changed.  Updated phone number for both pt and his spouse.    Pt confirmed he would have been sleeping at time of pause.  He denies any recent symptoms or syncope.

## 2020-12-18 ENCOUNTER — Ambulatory Visit (INDEPENDENT_AMBULATORY_CARE_PROVIDER_SITE_OTHER): Payer: Medicare Other

## 2020-12-18 DIAGNOSIS — R55 Syncope and collapse: Secondary | ICD-10-CM

## 2020-12-18 LAB — CUP PACEART REMOTE DEVICE CHECK
Date Time Interrogation Session: 20220425230612
Implantable Pulse Generator Implant Date: 20200129

## 2021-01-07 NOTE — Progress Notes (Signed)
Carelink Summary Report / Loop Recorder 

## 2021-01-21 LAB — CUP PACEART REMOTE DEVICE CHECK
Date Time Interrogation Session: 20220528230410
Implantable Pulse Generator Implant Date: 20200129

## 2021-01-22 ENCOUNTER — Ambulatory Visit (INDEPENDENT_AMBULATORY_CARE_PROVIDER_SITE_OTHER): Payer: Medicare Other

## 2021-01-22 DIAGNOSIS — R55 Syncope and collapse: Secondary | ICD-10-CM | POA: Diagnosis not present

## 2021-02-13 NOTE — Progress Notes (Signed)
Carelink Summary Report / Loop Recorder 

## 2021-02-22 ENCOUNTER — Ambulatory Visit (INDEPENDENT_AMBULATORY_CARE_PROVIDER_SITE_OTHER): Payer: Medicare Other

## 2021-02-22 DIAGNOSIS — R55 Syncope and collapse: Secondary | ICD-10-CM

## 2021-02-22 LAB — CUP PACEART REMOTE DEVICE CHECK
Date Time Interrogation Session: 20220630231920
Implantable Pulse Generator Implant Date: 20200129

## 2021-03-13 NOTE — Progress Notes (Signed)
Carelink Summary Report / Loop Recorder 

## 2021-03-26 ENCOUNTER — Ambulatory Visit (INDEPENDENT_AMBULATORY_CARE_PROVIDER_SITE_OTHER): Payer: Medicare Other

## 2021-03-26 DIAGNOSIS — R55 Syncope and collapse: Secondary | ICD-10-CM

## 2021-03-27 LAB — CUP PACEART REMOTE DEVICE CHECK
Date Time Interrogation Session: 20220802232146
Implantable Pulse Generator Implant Date: 20200129

## 2021-04-19 NOTE — Progress Notes (Signed)
Carelink Summary Report / Loop Recorder 

## 2021-04-30 ENCOUNTER — Ambulatory Visit (INDEPENDENT_AMBULATORY_CARE_PROVIDER_SITE_OTHER): Payer: Medicare Other

## 2021-04-30 DIAGNOSIS — R55 Syncope and collapse: Secondary | ICD-10-CM

## 2021-04-30 LAB — CUP PACEART REMOTE DEVICE CHECK
Date Time Interrogation Session: 20220904232847
Implantable Pulse Generator Implant Date: 20200129

## 2021-05-08 NOTE — Progress Notes (Signed)
Carelink Summary Report / Loop Recorder 

## 2021-05-31 ENCOUNTER — Ambulatory Visit (INDEPENDENT_AMBULATORY_CARE_PROVIDER_SITE_OTHER): Payer: Medicare Other

## 2021-05-31 DIAGNOSIS — R55 Syncope and collapse: Secondary | ICD-10-CM | POA: Diagnosis not present

## 2021-06-03 LAB — CUP PACEART REMOTE DEVICE CHECK
Date Time Interrogation Session: 20221007232613
Implantable Pulse Generator Implant Date: 20200129

## 2021-06-10 NOTE — Progress Notes (Signed)
Carelink Summary Report / Loop Recorder 

## 2021-07-02 ENCOUNTER — Ambulatory Visit (INDEPENDENT_AMBULATORY_CARE_PROVIDER_SITE_OTHER): Payer: Medicare Other

## 2021-07-02 DIAGNOSIS — R55 Syncope and collapse: Secondary | ICD-10-CM | POA: Diagnosis not present

## 2021-07-05 LAB — CUP PACEART REMOTE DEVICE CHECK
Date Time Interrogation Session: 20221109222706
Implantable Pulse Generator Implant Date: 20200129

## 2021-07-09 NOTE — Progress Notes (Signed)
Carelink Summary Report / Loop Recorder 

## 2021-08-05 ENCOUNTER — Ambulatory Visit (INDEPENDENT_AMBULATORY_CARE_PROVIDER_SITE_OTHER): Payer: Medicare Other

## 2021-08-05 DIAGNOSIS — R55 Syncope and collapse: Secondary | ICD-10-CM

## 2021-08-06 LAB — CUP PACEART REMOTE DEVICE CHECK
Date Time Interrogation Session: 20221212222722
Implantable Pulse Generator Implant Date: 20200129

## 2021-08-08 ENCOUNTER — Telehealth: Payer: Self-pay

## 2021-08-08 NOTE — Telephone Encounter (Signed)
ILR report of 4 second pause 08/07/21 @ 22:14. Patient called and reports he was asleep during time. Advised to call if any questions or concerns arise. Will continue to monitor.

## 2021-08-13 NOTE — Progress Notes (Signed)
Carelink Summary Report / Loop Recorder 

## 2021-09-16 ENCOUNTER — Ambulatory Visit (INDEPENDENT_AMBULATORY_CARE_PROVIDER_SITE_OTHER): Payer: Medicare Other

## 2021-09-16 DIAGNOSIS — R55 Syncope and collapse: Secondary | ICD-10-CM | POA: Diagnosis not present

## 2021-09-16 LAB — CUP PACEART REMOTE DEVICE CHECK
Date Time Interrogation Session: 20230122234056
Implantable Pulse Generator Implant Date: 20200129

## 2021-09-26 NOTE — Progress Notes (Signed)
Carelink Summary Report / Loop Recorder 

## 2021-10-20 LAB — CUP PACEART REMOTE DEVICE CHECK
Date Time Interrogation Session: 20230224234141
Implantable Pulse Generator Implant Date: 20200129

## 2021-10-21 ENCOUNTER — Ambulatory Visit (INDEPENDENT_AMBULATORY_CARE_PROVIDER_SITE_OTHER): Payer: Medicare Other

## 2021-10-21 DIAGNOSIS — R55 Syncope and collapse: Secondary | ICD-10-CM | POA: Diagnosis not present

## 2021-10-24 NOTE — Progress Notes (Signed)
Carelink Summary Report / Loop Recorder 

## 2021-11-25 ENCOUNTER — Ambulatory Visit (INDEPENDENT_AMBULATORY_CARE_PROVIDER_SITE_OTHER): Payer: Medicare Other

## 2021-11-25 DIAGNOSIS — R55 Syncope and collapse: Secondary | ICD-10-CM

## 2021-11-25 LAB — CUP PACEART REMOTE DEVICE CHECK
Date Time Interrogation Session: 20230401001727
Implantable Pulse Generator Implant Date: 20200129

## 2021-12-05 NOTE — Progress Notes (Signed)
Carelink Summary Report / Loop Recorder 

## 2021-12-26 LAB — CUP PACEART REMOTE DEVICE CHECK
Date Time Interrogation Session: 20230504002002
Implantable Pulse Generator Implant Date: 20200129

## 2021-12-30 ENCOUNTER — Ambulatory Visit (INDEPENDENT_AMBULATORY_CARE_PROVIDER_SITE_OTHER): Payer: Medicare Other

## 2021-12-30 DIAGNOSIS — R55 Syncope and collapse: Secondary | ICD-10-CM | POA: Diagnosis not present

## 2022-01-14 NOTE — Progress Notes (Signed)
Carelink Summary Report / Loop Recorder 

## 2022-01-27 ENCOUNTER — Telehealth: Payer: Self-pay

## 2022-01-27 NOTE — Telephone Encounter (Signed)
Spoke to patient and denies any symptoms. States he has been doing well. Has upcoming apt 02/10/22 @ 3:30 pm, pt aware. Advised if he has any symptoms to call and let us know. Voiced understanding.

## 2022-01-27 NOTE — Telephone Encounter (Signed)
Pt left a voicemail returning nurse call.

## 2022-01-27 NOTE — Telephone Encounter (Signed)
Calling for symptoms of pause episode from 01/23/2022 20:47PM spoke with patient wife she stated that patient is outside cutting down trees, number to device clinic given for patient to call back

## 2022-01-29 NOTE — Telephone Encounter (Signed)
He will need to return to see me regarding PPM insertion. GT

## 2022-01-30 NOTE — Telephone Encounter (Signed)
Patient called and advised of Dr. Bruna Potter recommendation. Patient has up coming 02/10/22. Patient advised if he has any symptoms to call the device clinic, if loc call 911 and go to ED. Patient aware.

## 2022-01-30 NOTE — Telephone Encounter (Signed)
Spoke to patients wife and reports patient is not available right now but will call back later today. Do not see DPR on file to release information.

## 2022-02-02 LAB — CUP PACEART REMOTE DEVICE CHECK
Date Time Interrogation Session: 20230606002606
Implantable Pulse Generator Implant Date: 20200129

## 2022-02-03 ENCOUNTER — Ambulatory Visit (INDEPENDENT_AMBULATORY_CARE_PROVIDER_SITE_OTHER): Payer: Medicare Other

## 2022-02-03 DIAGNOSIS — R55 Syncope and collapse: Secondary | ICD-10-CM

## 2022-02-10 ENCOUNTER — Ambulatory Visit (INDEPENDENT_AMBULATORY_CARE_PROVIDER_SITE_OTHER): Payer: Medicare Other | Admitting: Internal Medicine

## 2022-02-10 ENCOUNTER — Encounter: Payer: Self-pay | Admitting: Internal Medicine

## 2022-02-10 VITALS — BP 138/74 | HR 75 | Ht 69.0 in | Wt 212.0 lb

## 2022-02-10 DIAGNOSIS — R55 Syncope and collapse: Secondary | ICD-10-CM

## 2022-02-10 DIAGNOSIS — R001 Bradycardia, unspecified: Secondary | ICD-10-CM | POA: Diagnosis not present

## 2022-02-10 NOTE — Patient Instructions (Addendum)
Medication Instructions:  Your physician has recommended you make the following change in your medication:    Wean off your carvedilol 12.5 mg   A.   Take 1/2 tablet by mouth twice daily for 7 days  B.   Then take 1/2 tablet by mouth once day for 7 days  C.   Then STOP!  Labwork: None ordered.  Testing/Procedures: None ordered.  Follow-Up: Your physician wants you to follow-up in: 6 Months with Lewayne Bunting, MD or one of the following Advanced Practice Providers on your designated Care Team:   Francis Dowse, [place] Casimiro Needle "Mardelle Matte" Lanna Poche, [place]   Any Other Special Instructions Will Be Listed Below (If Applicable).  If you need a refill on your cardiac medications before your next appointment, please call your pharmacy.   Important Information About Sugar

## 2022-02-10 NOTE — Progress Notes (Signed)
HPI [name] returns today for followup. He is a pleasant 86 yo man with a h/o remote syncope who underwent ILR insertion over 2 years ago. He has not had syncope in the interim. He denies chest pain or sob. He has not had palpitations. His ILR demonstrates NSVT as well has pauses, mostly but not all nocturnal. He was asymptomatic. He denies chest pain or sob.  No Known Allergies   Current Outpatient Medications  Medication Sig Dispense Refill   Biotin 1000 MCG tablet Take 1,000 mcg by mouth daily.      cholecalciferol (VITAMIN D3) 25 MCG (1000 UT) tablet Take 1,000 Units by mouth daily.     ELIQUIS 2.5 MG TABS tablet Take 2.5 mg by mouth 2 (two) times daily.     lisinopril (PRINIVIL,ZESTRIL) 20 MG tablet Take 20 mg by mouth 2 (two) times daily.      Magnesium 250 MG TABS Take 1 tablet by mouth at bedtime.     Magnesium 500 MG TABS Take 1 tablet by mouth daily.      Multiple Vitamins-Minerals (MULTIVITAMIN PO) Take 1 tablet by mouth daily.      omeprazole (PRILOSEC) 20 MG capsule Take 20 mg by mouth every morning.      No current facility-administered medications for this visit.     Past Medical History:  Diagnosis Date   Blood clotting disorder (HCC)    hx blood clot in lung   Hypertension    Pulmonary embolism (HCC)    RBBB     ROS:   All systems reviewed and negative except as noted in the HPI.   Past Surgical History:  Procedure Laterality Date   LOOP RECORDER INSERTION N/A 09/22/2018   Procedure: LOOP RECORDER INSERTION;  Surgeon: Marinus Maw, MD;  Location: [facility];  Service: Cardiovascular;  Laterality: N/A;     History reviewed. No pertinent family history.   Social History   Socioeconomic History   Marital status: Married    Spouse name: Not on file   Number of children: 2   Years of education: Not on file   Highest education level: 12th grade  Occupational History   Not on file  Tobacco Use   Smoking status: Former   Smokeless  tobacco: Never  Vaping Use   Vaping Use: Never used  Substance and Sexual Activity   Alcohol use: Yes    Comment: 1 glass of wine each night with dinner   Drug use: Never   Sexual activity: Not on file  Other Topics Concern   Not on file  Social History Narrative   Pt lives in 2 story home with his wife   Has 2 adult sons   High school graduate   Retired Merchandiser, retail for [employer] Determinants of Corporate investment banker Strain: Not on file  Food Insecurity: Not on file  Transportation Needs: Not on file  Physical Activity: Not on file  Stress: Not on file  Social Connections: Not on file  Intimate Partner Violence: Not on file     BP 138/74   Pulse 75   Ht 5\' 9"  (1.753 m)   Wt 212 lb (96.2 kg)   SpO2 96%   BMI 31.31 kg/m   Physical Exam:  Well appearing NAD HEENT: Unremarkable Neck:  No JVD, no thyromegally Lymphatics:  No adenopathy Back:  No CVA tenderness Lungs:  Clear with no wheezes HEART:  Regular rate rhythm,  no murmurs, no rubs, no clicks Abd:  soft, positive bowel sounds, no organomegally, no rebound, no guarding Ext:  2 plus pulses, no edema, no cyanosis, no clubbing Skin:  No rashes no nodules Neuro:  CN II through XII intact, motor grossly intact   DEVICE  Normal device function.  See PaceArt for details.   Assess/Plan:  1. Syncope - he has had no recurrent episodes in the past year.  2. NSVT - the etiology is unclear and he is asymptomatic.   His EF normal. He will undergo watchful waiting  3. Sinus bradycardia - He has had no symptomatic bradycardia. We will follow. Because of his pauses, he will wean off of coreg. He may require additional bp lowering meds. 4. 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 We will follow for worsening conduction system disease. This has so far not developed.   Sharlot Gowda Duke Weisensel,MD

## 2022-02-12 LAB — CUP PACEART INCLINIC DEVICE CHECK
Date Time Interrogation Session: 20230619141935
Implantable Pulse Generator Implant Date: 20200129

## 2022-02-14 ENCOUNTER — Telehealth: Payer: Self-pay | Admitting: Internal Medicine

## 2022-02-14 NOTE — Telephone Encounter (Signed)
Spoke with patient who states Dr. Ladona Ridgel told him to call when his BP was higher than 140 systolic.  Patient reports BP readings below: 6/22 at 11:30AM--163/55, 3:45PM--157/79 6/23 this morning--178/92  Patient did not have HR readings but reports he feels his HR has been WNL for him. Patient denies any dizziness, headache, CP.  Carvedilol decreased at last office visit on 02/10/22, patient reports he is currently taking 6.25mg  BID for 7 days.  Will forward to Dr. Ladona Ridgel to review and advise.

## 2022-02-17 MED ORDER — AMLODIPINE BESYLATE 5 MG PO TABS: 5.0000 mg | ORAL_TABLET | Freq: Every day | ORAL | 3 refills | Status: DC

## 2022-02-17 NOTE — Telephone Encounter (Signed)
Discussed with Dr. Ladona Ridgel  Pt will start amlodipine 5 mg PO daily  Sent to pharmacy  Pt will call with further BP issues

## 2022-02-26 NOTE — Progress Notes (Signed)
Carelink Summary Report / Loop Recorder 

## 2022-03-02 LAB — CUP PACEART REMOTE DEVICE CHECK
Date Time Interrogation Session: 20230709002733
Implantable Pulse Generator Implant Date: 20200129

## 2022-03-05 ENCOUNTER — Telehealth: Payer: Self-pay | Admitting: Internal Medicine

## 2022-03-05 NOTE — Telephone Encounter (Signed)
Pt c/o BP issue: STAT if pt c/o blurred vision, one-sided weakness or slurred speech  1. What are your last 5 BP readings?  02/18/22 9:30 am- 135/73 HR 60 02/19/22 4:45 pm: 140/71 HR 81 02/20/22 8:25 am: 155/86 HR 56  02/21/22 1:45 pm: 148/68 HR 61 02/22/22 8:40 am: 153/79 HR 55 02/23/22 2:15 pm: 136/68 HR 70 02/24/22 1:50 pm: 151/73 HR 70 02/25/22 8:30 am: 147/67 HR 56 02/26/22 8:35 am: 155/70 HR 51  02/27/22 2:15 pm: 145/71 HR 90 03/01/22 7:20 pm: 132/69 HR 83 03/03/22 7:18 pm: 165/72 HR 81 03/04/22 4:55 pm: 148/76 HR 79  2. Are you having any other symptoms (ex. Dizziness, headache, blurred vision, passed out)? No patient said he feels fine  3. What is your BP issue? Patient was not sure if Dr. Ladona Ridgel wanted to add more Amlodipine to his medication regimen to control his BP

## 2022-03-06 MED ORDER — CARVEDILOL 3.125 MG PO TABS: 3.1250 mg | ORAL_TABLET | Freq: Two times a day (BID) | ORAL | 3 refills | Status: DC

## 2022-03-06 NOTE — Telephone Encounter (Signed)
Discussed with Dr. Ladona Ridgel.  Per Dr. Ladona Ridgel have Pt start carvedilol 3.125 mg by mouth BID.  Pt aware.  Prescription sent.  Pt will continue to keep BP log.

## 2022-03-10 ENCOUNTER — Ambulatory Visit (INDEPENDENT_AMBULATORY_CARE_PROVIDER_SITE_OTHER): Payer: Medicare Other

## 2022-03-10 DIAGNOSIS — R001 Bradycardia, unspecified: Secondary | ICD-10-CM

## 2022-03-18 ENCOUNTER — Telehealth: Payer: Self-pay

## 2022-03-18 NOTE — Telephone Encounter (Signed)
ILR alert for pause, 6 sec at 0913 with CHB noted. Routing for further review. - JJB  Unsuccessful telephone encounter to patient to assess for s/s bradycardia during what may be waking hours 03/17/22. [employer] compliant message left with wife requesting call back to [phone]. Of note coreg was discontinued 02/10/22 for bradycardia/pauses however resumed for hypertension 03/06/22.

## 2022-03-18 NOTE — Telephone Encounter (Signed)
Patient called back and left vm for a call back from a nurse

## 2022-03-18 NOTE — Telephone Encounter (Signed)
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 Reports he takes BP daily and readings have been around 140's / 60's. ED precautions reviewed with verbal understanding.

## 2022-03-21 ENCOUNTER — Telehealth: Payer: Self-pay

## 2022-03-21 NOTE — Telephone Encounter (Addendum)
Spoke to A. Tillery, PA-C, verbal order obtained to STOP Coreg, continue to check BP over the weekend and if BP is still high call clinic to let us know.   Called patient to advise to STOP coreg, continue to check BP and if high on Monday call to let us know. Direct phone number provided to patient. Voiced understanding.

## 2022-03-21 NOTE — Addendum Note (Signed)
Addended by: Unk Lightning T on: 03/21/2022 09:05 AM   Modules accepted: Orders

## 2022-03-21 NOTE — Telephone Encounter (Signed)
LINQ alert received.  1 new pause episode 7/27 @ 19:17, duration 8sec. EGM shows heart block.  Spoke to patient, asymptomatic and does not recall any issues yesterday. Advised of ED precautions and call if he has any questions or concerns. Voiced understanding.

## 2022-03-25 ENCOUNTER — Ambulatory Visit (INDEPENDENT_AMBULATORY_CARE_PROVIDER_SITE_OTHER): Payer: Medicare Other | Admitting: Internal Medicine

## 2022-03-25 ENCOUNTER — Encounter: Payer: Self-pay | Admitting: Internal Medicine

## 2022-03-25 VITALS — BP 154/72 | HR 75 | Ht 69.0 in | Wt 205.0 lb

## 2022-03-25 DIAGNOSIS — R55 Syncope and collapse: Secondary | ICD-10-CM

## 2022-03-25 DIAGNOSIS — R001 Bradycardia, unspecified: Secondary | ICD-10-CM | POA: Diagnosis not present

## 2022-03-25 LAB — CBC WITH DIFFERENTIAL/PLATELET
Basophils Absolute: 0.1 10*3/uL (ref 0.0–0.2)
Basos: 1 %
EOS (ABSOLUTE): 0.2 10*3/uL (ref 0.0–0.4)
Eos: 2 %
Hematocrit: 48 % (ref 37.5–51.0)
Hemoglobin: 15.7 g/dL (ref 13.0–17.7)
Immature Grans (Abs): 0 10*3/uL (ref 0.0–0.1)
Immature Granulocytes: 0 %
Lymphocytes Absolute: 1.2 10*3/uL (ref 0.7–3.1)
Lymphs: 18 %
MCH: 29.6 pg (ref 26.6–33.0)
MCHC: 32.7 g/dL (ref 31.5–35.7)
MCV: 90 fL (ref 79–97)
Monocytes Absolute: 0.8 10*3/uL (ref 0.1–0.9)
Monocytes: 12 %
Neutrophils Absolute: 4.5 10*3/uL (ref 1.4–7.0)
Neutrophils: 67 %
Platelets: 194 10*3/uL (ref 150–450)
RBC: 5.31 x10E6/uL (ref 4.14–5.80)
RDW: 13.7 % (ref 11.6–15.4)
WBC: 6.8 10*3/uL (ref 3.4–10.8)

## 2022-03-25 LAB — BASIC METABOLIC PANEL
BUN/Creatinine Ratio: 13 (ref 10–24)
BUN: 18 mg/dL (ref 8–27)
CO2: 25 mmol/L (ref 20–29)
Calcium: 9.1 mg/dL (ref 8.6–10.2)
Chloride: 100 mmol/L (ref 96–106)
Creatinine, Ser: 1.38 mg/dL — ABNORMAL HIGH (ref 0.76–1.27)
Glucose: 100 mg/dL — ABNORMAL HIGH (ref 70–99)
Potassium: 4.5 mmol/L (ref 3.5–5.2)
Sodium: 137 mmol/L (ref 134–144)
eGFR: 51 mL/min/{1.73_m2} — ABNORMAL LOW (ref >59)

## 2022-03-25 NOTE — H&P (View-Only) (Signed)
HPI Kristopher Adams returns today for followup. He is a pleasant 86 yo man with a h/o remote syncope who underwent ILR insertion over 2 years ago. The patient has had an episode of syncope which correlates with an 8 second pause. He does not think he actually passed out but does admit to "going down." He denies chest pain.   No Known Allergies   Current Outpatient Medications  Medication Sig Dispense Refill   amLODipine (NORVASC) 5 MG tablet Take 1 tablet (5 mg total) by mouth daily. 90 tablet 3   Biotin 1000 MCG tablet Take 1,000 mcg by mouth in the morning.     cholecalciferol (VITAMIN D3) 25 MCG (1000 UT) tablet Take 1,000 Units by mouth in the morning.     ELIQUIS 2.5 MG TABS tablet Take 2.5 mg by mouth 2 (two) times daily.     lisinopril (PRINIVIL,ZESTRIL) 20 MG tablet Take 20 mg by mouth 2 (two) times daily.      Magnesium 250 MG TABS Take 250 mg by mouth at bedtime.     Magnesium 500 MG TABS Take 500 mg by mouth in the morning.     omeprazole (PRILOSEC) 20 MG capsule Take 20 mg by mouth in the morning.     acetaminophen (TYLENOL) 500 MG tablet Take 500-1,000 mg by mouth every 6 (six) hours as needed (for pain.).     Multiple Vitamin (MULTIVITAMIN WITH MINERALS) TABS tablet Take 1 tablet by mouth in the morning.     Polyethyl Glycol-Propyl Glycol (SYSTANE) 0.4-0.3 % SOLN Place 1 drop into both eyes in the morning and at bedtime.     No current facility-administered medications for this visit.     Past Medical History:  Diagnosis Date   Blood clotting disorder (HCC)    hx blood clot in lung   Hypertension    Pulmonary embolism (HCC)    RBBB     ROS:   All systems reviewed and negative except as noted in the HPI.   Past Surgical History:  Procedure Laterality Date   LOOP RECORDER INSERTION N/A 09/22/2018   Procedure: LOOP RECORDER INSERTION;  Surgeon: Marinus Maw, MD;  Location: [facility];  Service: Cardiovascular;  Laterality: N/A;     No family  history on file.   Social History   Socioeconomic History   Marital status: Married    Spouse name: Not on file   Number of children: 2   Years of education: Not on file   Highest education level: 12th grade  Occupational History   Not on file  Tobacco Use   Smoking status: Former   Smokeless tobacco: Never  Vaping Use   Vaping Use: Never used  Substance and Sexual Activity   Alcohol use: Yes    Comment: 1 glass of wine each night with dinner   Drug use: Never   Sexual activity: Not on file  Other Topics Concern   Not on file  Social History Narrative   Pt lives in 2 story home with his wife   Has 2 adult sons   High school graduate   Retired Merchandiser, retail for [employer] Determinants of Corporate investment banker Strain: Not on file  Food Insecurity: Not on file  Transportation Needs: Not on file  Physical Activity: Not on file  Stress: Not on file  Social Connections: Not on file  Intimate Partner Violence: Not on file     BP [place])  154/72   Pulse 75   Ht 5\' 9"  (1.753 m)   Wt 205 lb (93 kg)   SpO2 95%   BMI 30.27 kg/m   Physical Exam:  Well appearing NAD HEENT: Unremarkable Neck:  No JVD, no thyromegally Lymphatics:  No adenopathy Back:  No CVA tenderness Lungs:  Clear with no wheezes HEART:  Regular rate rhythm, no murmurs, no rubs, no clicks Abd:  soft, positive bowel sounds, no organomegally, no rebound, no guarding Ext:  2 plus pulses, no edema, no cyanosis, no clubbing Skin:  No rashes no nodules Neuro:  CN II through XII intact, motor grossly intact    DEVICE  Normal device function.  See PaceArt for details. ILR demonstrates multipel pauses of over 5 seconds during the daytime.  Assess/Plan:  Stokes Adams syncope -I have discussed the indications/risks/benefits/goals/expectations of DDD PM insertion and he wishes to proceed. We will do this ASAP out of concerns that he will soon pass out and injure himself. He is instructed not to drive.   HTN -he will continue Norvasc.   Haidy Kackley,MD

## 2022-03-25 NOTE — Progress Notes (Signed)
HPI [name] returns today for followup. He is a pleasant 86 yo man with a h/o remote syncope who underwent ILR insertion over 2 years ago. The patient has had an episode of syncope which correlates with an 8 second pause. He does not think he actually passed out but does admit to "going down." He denies chest pain.   No Known Allergies   Current Outpatient Medications  Medication Sig Dispense Refill   amLODipine (NORVASC) 5 MG tablet Take 1 tablet (5 mg total) by mouth daily. 90 tablet 3   Biotin 1000 MCG tablet Take 1,000 mcg by mouth in the morning.     cholecalciferol (VITAMIN D3) 25 MCG (1000 UT) tablet Take 1,000 Units by mouth in the morning.     ELIQUIS 2.5 MG TABS tablet Take 2.5 mg by mouth 2 (two) times daily.     lisinopril (PRINIVIL,ZESTRIL) 20 MG tablet Take 20 mg by mouth 2 (two) times daily.      Magnesium 250 MG TABS Take 250 mg by mouth at bedtime.     Magnesium 500 MG TABS Take 500 mg by mouth in the morning.     omeprazole (PRILOSEC) 20 MG capsule Take 20 mg by mouth in the morning.     acetaminophen (TYLENOL) 500 MG tablet Take 500-1,000 mg by mouth every 6 (six) hours as needed (for pain.).     Multiple Vitamin (MULTIVITAMIN WITH MINERALS) TABS tablet Take 1 tablet by mouth in the morning.     Polyethyl Glycol-Propyl Glycol (SYSTANE) 0.4-0.3 % SOLN Place 1 drop into both eyes in the morning and at bedtime.     No current facility-administered medications for this visit.     Past Medical History:  Diagnosis Date   Blood clotting disorder (HCC)    hx blood clot in lung   Hypertension    Pulmonary embolism (HCC)    RBBB     ROS:   All systems reviewed and negative except as noted in the HPI.   Past Surgical History:  Procedure Laterality Date   LOOP RECORDER INSERTION N/A 09/22/2018   Procedure: LOOP RECORDER INSERTION;  Surgeon: Marinus Maw, MD;  Location: [facility];  Service: Cardiovascular;  Laterality: N/A;     No family  history on file.   Social History   Socioeconomic History   Marital status: Married    Spouse name: Not on file   Number of children: 2   Years of education: Not on file   Highest education level: 12th grade  Occupational History   Not on file  Tobacco Use   Smoking status: Former   Smokeless tobacco: Never  Vaping Use   Vaping Use: Never used  Substance and Sexual Activity   Alcohol use: Yes    Comment: 1 glass of wine each night with dinner   Drug use: Never   Sexual activity: Not on file  Other Topics Concern   Not on file  Social History Narrative   Pt lives in 2 story home with his wife   Has 2 adult sons   High school graduate   Retired Merchandiser, retail for [employer] Determinants of Corporate investment banker Strain: Not on file  Food Insecurity: Not on file  Transportation Needs: Not on file  Physical Activity: Not on file  Stress: Not on file  Social Connections: Not on file  Intimate Partner Violence: Not on file     BP [place])  154/72   Pulse 75   Ht 5' 9" (1.753 m)   Wt 205 lb (93 kg)   SpO2 95%   BMI 30.27 kg/m   Physical Exam:  Well appearing NAD HEENT: Unremarkable Neck:  No JVD, no thyromegally Lymphatics:  No adenopathy Back:  No CVA tenderness Lungs:  Clear with no wheezes HEART:  Regular rate rhythm, no murmurs, no rubs, no clicks Abd:  soft, positive bowel sounds, no organomegally, no rebound, no guarding Ext:  2 plus pulses, no edema, no cyanosis, no clubbing Skin:  No rashes no nodules Neuro:  CN II through XII intact, motor grossly intact    DEVICE  Normal device function.  See PaceArt for details. ILR demonstrates multipel pauses of over 5 seconds during the daytime.  Assess/Plan:  Stokes Adams syncope -I have discussed the indications/risks/benefits/goals/expectations of DDD PM insertion and he wishes to proceed. We will do this ASAP out of concerns that he will soon pass out and injure himself. He is instructed not to drive.   HTN -he will continue Norvasc.   Matraca Hunkins,MD 

## 2022-03-25 NOTE — Patient Instructions (Addendum)
Medication Instructions:  Your physician recommends that you continue on your current medications as directed. Please refer to the Current Medication list given to you today.  *If you need a refill on your cardiac medications before your next appointment, please call your pharmacy*  Lab Work: You will have lab work drawn today: CBC, BMET  Testing/Procedures: None ordered.  Follow-Up: See instruction letter  Provider:   Lewayne Bunting, MD{or one of the following Advanced Practice Providers on your designated Care Team:   Francis Dowse, [place] Casimiro Needle "Mardelle Matte" Lanna Poche, [place]   Important Information About Sugar

## 2022-03-26 ENCOUNTER — Ambulatory Visit (HOSPITAL_COMMUNITY)
Admission: RE | Admit: 2022-03-26 | Discharge: 2022-03-27 | Disposition: A | Discharge status: Home / Self Care | Payer: Medicare Other | Source: Ambulatory Visit | Attending: Internal Medicine | Admitting: Internal Medicine

## 2022-03-26 ENCOUNTER — Other Ambulatory Visit: Payer: Self-pay

## 2022-03-26 ENCOUNTER — Encounter (HOSPITAL_COMMUNITY)
Admission: RE | Disposition: A | Discharge status: Home / Self Care | Payer: Self-pay | Source: Ambulatory Visit | Attending: Internal Medicine

## 2022-03-26 ENCOUNTER — Encounter (HOSPITAL_COMMUNITY): Payer: Self-pay | Admitting: Internal Medicine

## 2022-03-26 DIAGNOSIS — Z7901 Long term (current) use of anticoagulants: Secondary | ICD-10-CM | POA: Insufficient documentation

## 2022-03-26 DIAGNOSIS — I442 Atrioventricular block, complete: Secondary | ICD-10-CM | POA: Insufficient documentation

## 2022-03-26 DIAGNOSIS — I10 Essential (primary) hypertension: Secondary | ICD-10-CM | POA: Insufficient documentation

## 2022-03-26 DIAGNOSIS — R001 Bradycardia, unspecified: Secondary | ICD-10-CM

## 2022-03-26 DIAGNOSIS — Z79899 Other long term (current) drug therapy: Secondary | ICD-10-CM | POA: Insufficient documentation

## 2022-03-26 DIAGNOSIS — Z86711 Personal history of pulmonary embolism: Secondary | ICD-10-CM | POA: Diagnosis not present

## 2022-03-26 DIAGNOSIS — R55 Syncope and collapse: Secondary | ICD-10-CM | POA: Diagnosis not present

## 2022-03-26 HISTORY — PX: PACEMAKER IMPLANT: EP1218

## 2022-03-26 HISTORY — PX: LOOP RECORDER REMOVAL: EP1215

## 2022-03-26 SURGERY — PACEMAKER IMPLANT

## 2022-03-26 MED ORDER — HEPARIN (PORCINE) IN NACL 1000-0.9 UT/500ML-% IV SOLN
INTRAVENOUS | Status: AC
  Filled 2022-03-26: qty 500

## 2022-03-26 MED ORDER — ORAL CARE MOUTH RINSE: 15.0000 mL | OROMUCOSAL | Status: DC | PRN

## 2022-03-26 MED ORDER — CEFAZOLIN SODIUM-DEXTROSE 2-4 GM/100ML-% IV SOLN
2.0000 g | INTRAVENOUS | Status: AC
  Administered 2022-03-26: 2 g via INTRAVENOUS

## 2022-03-26 MED ORDER — FENTANYL CITRATE (PF) 100 MCG/2ML IJ SOLN
INTRAMUSCULAR | Status: DC | PRN
  Administered 2022-03-26 (×4): 12.5 ug via INTRAVENOUS

## 2022-03-26 MED ORDER — POVIDONE-IODINE 10 % EX SWAB
2.0000 | Freq: Once | CUTANEOUS | Status: AC
  Administered 2022-03-26: 2 via TOPICAL

## 2022-03-26 MED ORDER — LIDOCAINE HCL 1 % IJ SOLN
INTRAMUSCULAR | Status: AC
  Filled 2022-03-26: qty 60

## 2022-03-26 MED ORDER — MIDAZOLAM HCL 5 MG/5ML IJ SOLN
INTRAMUSCULAR | Status: DC | PRN
  Administered 2022-03-26 (×4): 1 mg via INTRAVENOUS

## 2022-03-26 MED ORDER — ONDANSETRON HCL 4 MG/2ML IJ SOLN: 4.0000 mg | Freq: Four times a day (QID) | INTRAMUSCULAR | Status: DC | PRN

## 2022-03-26 MED ORDER — ACETAMINOPHEN 325 MG PO TABS: 325.0000 mg | ORAL_TABLET | ORAL | Status: DC | PRN

## 2022-03-26 MED ORDER — HEPARIN (PORCINE) IN NACL 1000-0.9 UT/500ML-% IV SOLN
INTRAVENOUS | Status: DC | PRN
  Administered 2022-03-26: 500 mL

## 2022-03-26 MED ORDER — SODIUM CHLORIDE 0.9 % IV SOLN
80.0000 mg | INTRAVENOUS | Status: AC
  Administered 2022-03-26: 80 mg

## 2022-03-26 MED ORDER — CHLORHEXIDINE GLUCONATE 4 % EX LIQD
4.0000 | Freq: Once | CUTANEOUS | Status: DC
  Administered 2022-03-26: 4 via TOPICAL

## 2022-03-26 MED ORDER — IOHEXOL 350 MG/ML SOLN
INTRAVENOUS | Status: DC | PRN
  Administered 2022-03-26: 15 mL

## 2022-03-26 MED ORDER — SODIUM CHLORIDE 0.9 % IV SOLN
INTRAVENOUS | Status: AC
  Filled 2022-03-26: qty 2

## 2022-03-26 MED ORDER — CEFAZOLIN SODIUM-DEXTROSE 2-4 GM/100ML-% IV SOLN
INTRAVENOUS | Status: AC
  Filled 2022-03-26: qty 100

## 2022-03-26 MED ORDER — FENTANYL CITRATE (PF) 100 MCG/2ML IJ SOLN
INTRAMUSCULAR | Status: AC
  Filled 2022-03-26: qty 2

## 2022-03-26 MED ORDER — MIDAZOLAM HCL 5 MG/5ML IJ SOLN
INTRAMUSCULAR | Status: AC
  Filled 2022-03-26: qty 5

## 2022-03-26 MED ORDER — LIDOCAINE HCL (PF) 1 % IJ SOLN
INTRAMUSCULAR | Status: DC | PRN
  Administered 2022-03-26: 60 mL

## 2022-03-26 MED ORDER — SODIUM CHLORIDE 0.9 % IV SOLN: INTRAVENOUS | Status: DC

## 2022-03-26 MED ORDER — CEFAZOLIN SODIUM-DEXTROSE 1-4 GM/50ML-% IV SOLN
1.0000 g | Freq: Four times a day (QID) | INTRAVENOUS | Status: AC
  Administered 2022-03-26 – 2022-03-27 (×3): 1 g via INTRAVENOUS
  Filled 2022-03-26 (×3): qty 50

## 2022-03-26 SURGICAL SUPPLY — 13 items
CABLE SURGICAL S-101-97-12 (CABLE) ×2 IMPLANT
CATH RIGHTSITE C315HIS02 (CATHETERS) ×1 IMPLANT
IPG PACE AZUR XT DR MRI W1DR01 (Pacemaker) IMPLANT
KIT MICROPUNCTURE NIT STIFF (SHEATH) ×1 IMPLANT
LEAD CAPSURE NOVUS 5076-52CM (Lead) ×1 IMPLANT
LEAD SELECT SECURE 3830 383069 (Lead) IMPLANT
PACE AZURE XT DR MRI W1DR01 (Pacemaker) ×2 IMPLANT
PAD DEFIB RADIO PHYSIO CONN (PAD) ×2 IMPLANT
SELECT SECURE 3830 383069 (Lead) ×2 IMPLANT
SHEATH 7FR PRELUDE SNAP 13 (SHEATH) ×2 IMPLANT
SLITTER 6232ADJ (MISCELLANEOUS) ×1 IMPLANT
TRAY PACEMAKER INSERTION (PACKS) ×2 IMPLANT
WIRE HI TORQ VERSACORE-J 145CM (WIRE) ×1 IMPLANT

## 2022-03-26 NOTE — Discharge Instructions (Signed)
    Supplemental Discharge Instructions for  Pacemaker/Defibrillator Patients  Tomorrow, 03/27/22, send in a device transmission  Activity No heavy lifting or vigorous activity with your left/right arm for 6 to 8 weeks.  Do not raise your left/right arm above your head for one week.  Gradually raise your affected arm as drawn below.             03/31/22                      04/01/22                     04/02/22                   04/03/22 __  NO DRIVING for   1 week  ; you may begin driving on   0/27/25  .  WOUND CARE Keep the wound area clean and dry.  Do not get this area wet , no showers for one week; you may shower on  04/03/22   . Tomorrow, 03/27/22, remove the arm sling Tomorrow, 03/27/22 remove the LARGE outer plastic bandage.  Underneath the plastic bandage there are steri strips (paper tapes), DO NOT remove these. The tape/steri-strips on your wound will fall off; do not pull them off.  No bandage is needed on the site.  DO  NOT apply any creams, oils, or ointments to the wound area. If you notice any drainage or discharge from the wound, any swelling or bruising at the site, or you develop a fever > 101? F after you are discharged home, call the office at once.  Special Instructions You are still able to use cellular telephones; use the ear opposite the side where you have your pacemaker/defibrillator.  Avoid carrying your cellular phone near your device. When traveling through airports, show security personnel your identification card to avoid being screened in the metal detectors.  Ask the security personnel to use the hand wand. Avoid arc welding equipment, MRI testing (magnetic resonance imaging), TENS units (transcutaneous nerve stimulators).  Call the office for questions about other devices. Avoid electrical appliances that are in poor condition or are not properly grounded. Microwave ovens are safe to be near or to operate.

## 2022-03-26 NOTE — Interval H&P Note (Signed)
History and Physical Interval Note:  03/26/2022 5:48 PM  Kristopher Adams  has presented today for surgery, with the diagnosis of syncope.  The various methods of treatment have been discussed with the patient and family. After consideration of risks, benefits and other options for treatment, the patient has consented to  Procedure(s): PACEMAKER IMPLANT (N/A) LOOP RECORDER REMOVAL (N/A) as a surgical intervention.  The patient's history has been reviewed, patient examined, no change in status, stable for surgery.  I have reviewed the patient's chart and labs.  Questions were answered to the patient's satisfaction.     Lewayne Bunting

## 2022-03-27 ENCOUNTER — Encounter (HOSPITAL_COMMUNITY): Payer: Self-pay | Admitting: Internal Medicine

## 2022-03-27 ENCOUNTER — Ambulatory Visit (HOSPITAL_COMMUNITY): Payer: Medicare Other

## 2022-03-27 DIAGNOSIS — R55 Syncope and collapse: Secondary | ICD-10-CM | POA: Diagnosis not present

## 2022-03-27 DIAGNOSIS — I442 Atrioventricular block, complete: Secondary | ICD-10-CM | POA: Diagnosis not present

## 2022-03-27 DIAGNOSIS — Z7901 Long term (current) use of anticoagulants: Secondary | ICD-10-CM | POA: Diagnosis not present

## 2022-03-27 DIAGNOSIS — I10 Essential (primary) hypertension: Secondary | ICD-10-CM | POA: Diagnosis not present

## 2022-03-27 NOTE — Plan of Care (Signed)
  Problem: Health Behavior/Discharge Planning: Goal: Ability to manage health-related needs will improve Outcome: Progressing   Problem: Clinical Measurements: Goal: Will remain free from infection Outcome: Progressing Goal: Diagnostic test results will improve Outcome: Progressing   Problem: Coping: Goal: Level of anxiety will decrease Outcome: Progressing   Problem: Pain Managment: Goal: General experience of comfort will improve Outcome: Progressing   Problem: Safety: Goal: Ability to remain free from injury will improve Outcome: Progressing   Problem: Skin Integrity: Goal: Risk for impaired skin integrity will decrease Outcome: Progressing

## 2022-03-27 NOTE — Discharge Summary (Addendum)
ELECTROPHYSIOLOGY PROCEDURE DISCHARGE SUMMARY    Patient ID: Kristopher Adams,  MRN: [id number], DOB/AGE: 29-Aug-1935 86 y.o.  Admit date: 03/26/2022 Discharge date: 03/27/2022  Primary Care Physician: Kathlee Nations, MD  Electrophysiologist: Dr. Ladona Ridgel  Primary Discharge Diagnosis:  Syncope Symptomatic bradycardia status post pacemaker implantation this admission  Secondary Discharge Diagnosis:  PE (remotely) HTN RBBB  No Known Allergies   Procedures This Admission:  03/26/22 PPM CONCLUSIONS:   1. Successful implantation of a Medtronic dual-chamber pacemaker for symptomatic bradycardia due to complete heart block  2. No early apparent complications.      Brief HPI: Kristopher Adams is a 86 y.o. male was is followed by Dr. Ladona Ridgel out patient, had a loop monitor placed a couple years ago 2/2 syncope. He was now noted via his loop recorder to have an 8 second pause associated with abrupt weakness, and near syncope.  He was advised to under go PPM.  Hospital Course:  The patient was admitted and underwent implantation of a PPM and his loop removed with details noted in his procedure report.  He was monitored on telemetry overnight which demonstrated SR?VP, some AV pacing .  Left chest pacer site and loop site are without hematoma or ecchymosis.  The device was interrogated and found to be functioning normally.  CXR was obtained and demonstrated no pneumothorax status post device implantation.  Wound care, arm mobility, and restrictions were reviewed with the patient.  The patient feels well, denies any P/SOB, with minimal site discomfort.  He was examined by Dr. Ladona Ridgel and considered stable for discharge to home.   He is on Eliquis for DVT/PE hx.  He discussed his dosing specifically as per his PMD, and will efer to the primary for this.  I have asked that he ambulate regularly and hold Eliquis until 04/01/22   Physical Exam: Vitals:   03/26/22 2025 03/27/22 0005 03/27/22 1022 03/27/22 1024   BP: (!) 126/59 123/65 139/67   Pulse:  61    Resp: 14 20 16    Temp:  98.5 F (36.9 C) 98 F (36.7 C)   TempSrc:  Oral Oral   SpO2: 97% 98% 96% 97%  Weight:      Height:        GEN- The patient is well appearing, alert and oriented x 3 today.   HEENT: normocephalic, atraumatic; sclera clear, conjunctiva pink; hearing intact; oropharynx clear; neck supple, no JVP Lungs- CTA b/l, normal work of breathing.  No wheezes, rales, rhonchi Heart- RRR no murmurs, rubs or gallops, PMI not laterally displaced GI- soft, non-tender, non-distended Extremities- no clubbing, cyanosis, or edema MS- no significant deformity or atrophy Skin- warm and dry, no rash or lesion,  left chest both sites are without hematoma/ecchymosis Psych- euthymic mood, full affect Neuro- no gross deficits   Labs:   Lab Results  Component Value Date   WBC 6.8 03/25/2022   HGB 15.7 03/25/2022   HCT 48.0 03/25/2022   MCV 90 03/25/2022   PLT 194 03/25/2022    Recent Labs  Lab 03/25/22 1018  NA 137  K 4.5  CL 100  CO2 25  BUN 18  CREATININE 1.38*  CALCIUM 9.1  GLUCOSE 100*    Discharge Medications:  Allergies as of 03/27/2022   No Known Allergies      Medication List     TAKE these medications    acetaminophen 500 MG tablet Commonly known as: TYLENOL Take 500-1,000 mg by mouth every 6 (six) hours as  needed (for pain.).   amLODipine 5 MG tablet Commonly known as: NORVASC Take 1 tablet (5 mg total) by mouth daily.   Biotin 1000 MCG tablet Take 1,000 mcg by mouth in the morning.   cholecalciferol 25 MCG (1000 UNIT) tablet Commonly known as: VITAMIN D3 Take 1,000 Units by mouth in the morning.   Eliquis 2.5 MG Tabs tablet Generic drug: apixaban Take 2.5 mg by mouth 2 (two) times daily. Notes to patient: Resume on 04/01/22 morning   lisinopril 20 MG tablet Commonly known as: ZESTRIL Take 20 mg by mouth 2 (two) times daily.   Magnesium 500 MG Tabs Take 500 mg by mouth in the morning.    Magnesium 250 MG Tabs Take 250 mg by mouth at bedtime.   multivitamin with minerals Tabs tablet Take 1 tablet by mouth in the morning.   omeprazole 20 MG capsule Commonly known as: PRILOSEC Take 20 mg by mouth in the morning.   Systane 0.4-0.3 % Soln Generic drug: Polyethyl Glycol-Propyl Glycol Place 1 drop into both eyes in the morning and at bedtime.               Discharge Care Instructions  (From admission, onward)           Start     Ordered   03/27/22 0000  Discharge wound care:       Comments: As per AVS   03/27/22 1115            Disposition: Home Discharge Instructions     Diet - low sodium heart healthy   Complete by: As directed    Discharge wound care:   Complete by: As directed    As per AVS   Increase activity slowly   Complete by: As directed         Duration of Discharge Encounter: Greater than 30 minutes including physician time.  Norma Fredrickson, PA-C 03/27/2022 11:19 AM  EP Attending Patient seen and examined. Agree with above. The patient is doing well after PPM insertion. His CXR demonstrates normal DDD PM function. The patient's PM interrogation under my direction demonstrates normal DDD PM function. He will be discharged home with usual followup.  Sharlot Gowda Renwick Asman,MD

## 2022-03-30 ENCOUNTER — Emergency Department (HOSPITAL_COMMUNITY)
Admission: EM | Admit: 2022-03-30 | Discharge: 2022-03-30 | Disposition: A | Discharge status: Home / Self Care | Payer: Medicare Other | Attending: Emergency Medicine | Admitting: Emergency Medicine

## 2022-03-30 ENCOUNTER — Encounter (HOSPITAL_COMMUNITY): Payer: Self-pay

## 2022-03-30 ENCOUNTER — Emergency Department (HOSPITAL_COMMUNITY): Payer: Medicare Other

## 2022-03-30 ENCOUNTER — Other Ambulatory Visit: Payer: Self-pay

## 2022-03-30 DIAGNOSIS — I10 Essential (primary) hypertension: Secondary | ICD-10-CM | POA: Insufficient documentation

## 2022-03-30 DIAGNOSIS — Z79899 Other long term (current) drug therapy: Secondary | ICD-10-CM | POA: Insufficient documentation

## 2022-03-30 LAB — BASIC METABOLIC PANEL
Anion gap: 6 (ref 5–15)
BUN: 19 mg/dL (ref 8–23)
CO2: 26 mmol/L (ref 22–32)
Calcium: 9.5 mg/dL (ref 8.9–10.3)
Chloride: 105 mmol/L (ref 98–111)
Creatinine, Ser: 1.22 mg/dL (ref 0.61–1.24)
GFR, Estimated: 59 mL/min — ABNORMAL LOW (ref >60)
Glucose, Bld: 88 mg/dL (ref 70–99)
Potassium: 4.7 mmol/L (ref 3.5–5.1)
Sodium: 137 mmol/L (ref 135–145)

## 2022-03-30 LAB — TROPONIN I (HIGH SENSITIVITY)
Troponin I (High Sensitivity): 36 ng/L — ABNORMAL HIGH (ref <18)
Troponin I (High Sensitivity): 40 ng/L — ABNORMAL HIGH (ref <18)

## 2022-03-30 LAB — CBC
HCT: 44.9 % (ref 39.0–52.0)
Hemoglobin: 15 g/dL (ref 13.0–17.0)
MCH: 30.1 pg (ref 26.0–34.0)
MCHC: 33.4 g/dL (ref 30.0–36.0)
MCV: 90.2 fL (ref 80.0–100.0)
Platelets: 155 10*3/uL (ref 150–400)
RBC: 4.98 MIL/uL (ref 4.22–5.81)
RDW: 13.2 % (ref 11.5–15.5)
WBC: 7.1 10*3/uL (ref 4.0–10.5)
nRBC: 0 % (ref 0.0–0.2)

## 2022-03-30 MED ORDER — SODIUM CHLORIDE 0.9 % IV SOLN: INTRAVENOUS | Status: DC

## 2022-03-30 NOTE — ED Notes (Signed)
Patient transported to X-ray 

## 2022-03-30 NOTE — Discharge Instructions (Signed)
Always take your blood pressure when you are lying down for 5 minutes.  Keep track of the numbers that you get.  Also check your heart rate regular.  Restart your carvedilol at 3.125 mg twice a day, this evening.  Follow-up with Dr. Ladona Ridgel as scheduled.  Return here, if needed.

## 2022-03-30 NOTE — ED Triage Notes (Signed)
Pacemaker placed last week and now his blood pressure is high and his HR is high when it is suppose to be paced around 72

## 2022-03-30 NOTE — ED Provider Notes (Signed)
[facility] EMERGENCY DEPARTMENT Provider Note   CSN: [id number] Arrival date & time: 03/30/22  1055     History  Chief Complaint  Patient presents with   Pacemaker Problem    Kristopher Adams is a 86 y.o. male.  HPI Patient is here today because he is concerned about high blood pressure and heart rate elevation.  He checked his blood pressure at home today and it was 190/100, with a pulse of 91.  Therefore he decided to come here for evaluation.  He had a pacemaker placed 1 week ago.  His blood pressure and pulses have been fairly normal since then.  He denies concurrent illnesses.  The wounds where his loop recorder was removed and pacemaker was placed are healing well without pain or drainage.  He denies fever, chills, cough, shortness of breath, nausea or vomiting.  He states that he has not taken his Eliquis for the last 5 days and is due to restart it 04/01/22, after the pacemaker placement.  Also prior to receiving pacemaker his carvedilol was discontinued and he was placed on amlodipine 5 mg ``in addition to lisinopril for hypertension.    Home Medications Prior to Admission medications   Medication Sig Start Date End Date Taking? Authorizing Provider  acetaminophen (TYLENOL) 500 MG tablet Take 500-1,000 mg by mouth every 6 (six) hours as needed (for pain.).    [provider]  amLODipine (NORVASC) 5 MG tablet Take 1 tablet (5 mg total) by mouth daily. 02/17/22 02/12/23  Evans Lance, MD  Biotin 1000 MCG tablet Take 1,000 mcg by mouth in the morning.    [provider]  cholecalciferol (VITAMIN D3) 25 MCG (1000 UT) tablet Take 1,000 Units by mouth in the morning.    [provider]  ELIQUIS 2.5 MG TABS tablet Take 2.5 mg by mouth 2 (two) times daily. 12/17/21   [provider]  lisinopril (PRINIVIL,ZESTRIL) 20 MG tablet Take 20 mg by mouth 2 (two) times daily.  02/28/18   [provider]  Magnesium 250 MG TABS Take 250 mg by  mouth at bedtime.    [provider]  Magnesium 500 MG TABS Take 500 mg by mouth in the morning.    [provider]  Multiple Vitamin (MULTIVITAMIN WITH MINERALS) TABS tablet Take 1 tablet by mouth in the morning.    [provider]  omeprazole (PRILOSEC) 20 MG capsule Take 20 mg by mouth in the morning. 02/28/18   [provider]  Polyethyl Glycol-Propyl Glycol (SYSTANE) 0.4-0.3 % SOLN Place 1 drop into both eyes in the morning and at bedtime.    [provider]      Allergies    Patient has no known allergies.    Review of Systems   Review of Systems  Physical Exam Updated Vital Signs BP (!) 149/72   Pulse 69   Temp 98 F (36.7 C)   Resp 15   Ht '5\' 9"'  (1.753 m)   Wt 90.7 kg   SpO2 96%   BMI 29.53 kg/m  Physical Exam Vitals and nursing note reviewed.  Constitutional:      Appearance: He is well-developed. He is not ill-appearing.  HENT:     Head: Normocephalic and atraumatic.     Right Ear: External ear normal.     Left Ear: External ear normal.  Eyes:     Conjunctiva/sclera: Conjunctivae normal.     Pupils: Pupils are equal, round, and reactive to light.  Neck:     Trachea: Phonation normal.  Cardiovascular:     Rate and Rhythm: Normal rate and regular rhythm.     Heart sounds: Normal heart sounds.  Pulmonary:     Effort: Pulmonary effort is normal.     Breath sounds: Normal breath sounds.  Chest:     Chest wall: No tenderness (Wounds of left upper chest wall are bandaged, but are nontender to palpation.).  Abdominal:     Palpations: Abdomen is soft.     Tenderness: There is no abdominal tenderness.  Musculoskeletal:        General: Normal range of motion.     Cervical back: Normal range of motion and neck supple.     Right lower leg: No edema.     Left lower leg: No edema.  Skin:    General: Skin is warm and dry.  Neurological:     Mental Status: He is alert and oriented to person, place, and time.     Cranial  Nerves: No cranial nerve deficit.     Sensory: No sensory deficit.     Motor: No abnormal muscle tone.     Coordination: Coordination normal.  Psychiatric:        Behavior: Behavior normal.        Thought Content: Thought content normal.        Judgment: Judgment normal.     ED Results / Procedures / Treatments   Labs (all labs ordered are listed, but only abnormal results are displayed) Labs Reviewed  BASIC METABOLIC PANEL - Abnormal; Notable for the following components:      Result Value   GFR, Estimated 59 (*)    All other components within normal limits  TROPONIN I (HIGH SENSITIVITY) - Abnormal; Notable for the following components:   Troponin I (High Sensitivity) 36 (*)    All other components within normal limits  TROPONIN I (HIGH SENSITIVITY) - Abnormal; Notable for the following components:   Troponin I (High Sensitivity) 40 (*)    All other components within normal limits  CBC    EKG EKG Interpretation  Date/Time:  Sunday March 30 2022 11:09:00 EDT Ventricular Rate:  104 PR Interval:  200 QRS Duration: 124 QT Interval:  360 QTC Calculation: 473 R Axis:   -59 Text Interpretation: Atrial-sensed ventricular-paced rhythm with Premature atrial complexes Abnormal ECG When compared with ECG of 27-Mar-2022 06:21, PREVIOUS ECG IS PRESENT Since last tracing rate faster Otherwise no significant change Confirmed by Daleen Bo [phone]) on 03/30/2022 11:46:29 AM  Radiology DG Chest 2 View  Result Date: 03/30/2022 CLINICAL DATA:  Pacemaker not working properly EXAM: CHEST - 2 VIEW COMPARISON:  Three days ago FINDINGS: Dual-chamber pacer leads from the left. There is no edema, consolidation, effusion, or pneumothorax. Stable heart size and mediastinal contours. IMPRESSION: No evidence of acute disease. Electronically Signed   By: Jorje Guild M.D.   On: 03/30/2022 11:45    Procedures Procedures    Medications Ordered in ED Medications  0.9 %  sodium chloride infusion  (0 mLs Intravenous Stopped 03/30/22 1905)    ED Course/ Medical Decision Making/ A&P                           Medical Decision Making Patient presenting for elevation of blood pressure and heart rate, from his recent baseline after pacemaker placement.  He does not have any concurrent illnesses.  Initial EKG indicates atrial sensed pacing, unchanged  from prior.  Patient will be screened for metabolic disorders such as processes and complications from pacemaker placement.  Amount and/or Complexity of Data Reviewed Independent Historian:     Details: He is a cogent historian Labs: ordered.    Details: CBC, c-Met, troponin, delta troponin -- normal except mild stable troponin elevation Radiology: ordered. Discussion of management or test interpretation with external provider(s): Case discussed with cardiology, who agrees with the care; Dr. Marcelle Smiling  Risk Prescription drug management. Decision regarding hospitalization. Risk Details: Patient presenting for evaluation of elevated heart rate and blood pressure from baseline.  He recently had pacemaker placement and loop recorder removed on 03/26/2022.  Patient has mild hypertension in the ED and heart rates in the 80s to 90.  He was recently taken off carvedilol, due to a sinus pause.  He is on amlodipine and lisinopril for blood pressure.  Case was discussed with cardiologist.  We will add back carvedilol, twice a day, for rate control and blood pressure improvement.  He currently has this medicine at home, and will take 3.125 mg twice daily.  He has a follow-up appointment with his electrophysiologist soon.  No indication for acute infection, metabolic disorders or hemodynamic instability.  Doubt hypertensive urgency.  He is stable for discharge.           Final Clinical Impression(s) / ED Diagnoses Final diagnoses:  Hypertension, unspecified type    Rx / DC Orders ED Discharge Orders     None         Daleen Bo,  MD 03/30/22 2044

## 2022-03-30 NOTE — ED Notes (Signed)
RN assisted pt to restroom. Pt walked down with a steady gait.

## 2022-03-30 NOTE — ED Notes (Signed)
Difficulty getting the medtronic to transport  sent

## 2022-03-30 NOTE — ED Notes (Signed)
Patient has medtronic pacemaker

## 2022-04-10 ENCOUNTER — Ambulatory Visit (INDEPENDENT_AMBULATORY_CARE_PROVIDER_SITE_OTHER): Payer: Medicare Other

## 2022-04-10 DIAGNOSIS — I472 Ventricular tachycardia, unspecified: Secondary | ICD-10-CM

## 2022-04-10 DIAGNOSIS — R55 Syncope and collapse: Secondary | ICD-10-CM | POA: Diagnosis not present

## 2022-04-10 LAB — CUP PACEART INCLINIC DEVICE CHECK
Battery Remaining Longevity: 140 mo
Battery Voltage: 3.21 V
Brady Statistic AP VP Percent: 39.31 %
Brady Statistic AP VS Percent: 0.07 %
Brady Statistic AS VP Percent: 60.06 %
Brady Statistic AS VS Percent: 0.56 %
Brady Statistic RA Percent Paced: 39.36 %
Brady Statistic RV Percent Paced: 99.37 %
Date Time Interrogation Session: 20230817125319
Implantable Lead Implant Date: 20230802
Implantable Lead Implant Date: 20230802
Implantable Lead Location: 753859
Implantable Lead Location: 753860
Implantable Lead Model: 3830
Implantable Lead Model: 5076
Implantable Pulse Generator Implant Date: 20230802
Lead Channel Impedance Value: 380 Ohm
Lead Channel Impedance Value: 456 Ohm
Lead Channel Impedance Value: 589 Ohm
Lead Channel Impedance Value: 646 Ohm
Lead Channel Pacing Threshold Amplitude: 0.5 V
Lead Channel Pacing Threshold Amplitude: 0.625 V
Lead Channel Pacing Threshold Pulse Width: 0.4 ms
Lead Channel Pacing Threshold Pulse Width: 0.4 ms
Lead Channel Sensing Intrinsic Amplitude: 2.375 mV
Lead Channel Sensing Intrinsic Amplitude: 2.5 mV
Lead Channel Sensing Intrinsic Amplitude: 31.625 mV
Lead Channel Sensing Intrinsic Amplitude: 31.625 mV
Lead Channel Setting Pacing Amplitude: 3.5 V
Lead Channel Setting Pacing Amplitude: 3.5 V
Lead Channel Setting Pacing Pulse Width: 0.4 ms
Lead Channel Setting Sensing Sensitivity: 1.2 mV

## 2022-04-10 NOTE — Patient Instructions (Signed)
   After Your Pacemaker   Monitor your pacemaker site for redness, swelling, and drainage. Call the device clinic at [phone] if you experience these symptoms or fever/chills.  Your incision was closed with Steri-strips or staples:  You may shower 7 days after your procedure and wash your incision with soap and water. Avoid lotions, ointments, or perfumes over your incision until it is well-healed.  You may use a hot tub or a pool after your wound check appointment if the incision is completely closed.  Do not lift, push or pull greater than 10 pounds with the affected arm until May 08 2022 There are no other restrictions in arm movement after your wound check appointment.  You may drive, unless driving has been restricted by your healthcare providers.   Remote monitoring is used to monitor your pacemaker from home. This monitoring is scheduled every 91 days by our office. It allows [place] to keep an eye on the functioning of your device to ensure it is working properly. You will routinely see your Electrophysiologist annually (more often if necessary).

## 2022-04-10 NOTE — Progress Notes (Signed)
Wound check appointment. Steri-strips removed. PPM wound without redness or edema with incision edges approximated, wound well healed.  Loop explant site steri strips removed and presents with small pin point area of small amount of bright red oozing.  Pressure applied per E. Watts, Charity fundraiser and area assessed.  Loop explant area is healing WNL with only small area of unapproximated wound.  Area cleansed by nurse and bleeding immediately stopped, E. Watts, RN determined bandage/covering is not needed and gave instructions to the patient to monitor and wait 1 more day before resuming showers.   PPM Normal device function. Thresholds, sensing, and impedances consistent with implant measurements. Device programmed at 3.5V/auto capture programmed on for extra safety margin until 3 month visit. Histogram distribution appropriate for patient and level of activity. No mode switches or high ventricular rates noted. Patient educated about wound care, arm mobility, lifting restrictions. ROV in 3 months with implanting physician.

## 2022-04-10 NOTE — Progress Notes (Deleted)

## 2022-04-14 NOTE — Progress Notes (Signed)
Carelink Summary Report / Loop Recorder 

## 2022-06-27 ENCOUNTER — Ambulatory Visit (INDEPENDENT_AMBULATORY_CARE_PROVIDER_SITE_OTHER): Payer: Medicare Other

## 2022-06-27 DIAGNOSIS — I472 Ventricular tachycardia, unspecified: Secondary | ICD-10-CM | POA: Diagnosis not present

## 2022-06-27 LAB — CUP PACEART REMOTE DEVICE CHECK
Battery Remaining Longevity: 149 mo
Battery Voltage: 3.2 V
Brady Statistic AP VP Percent: 32.71 %
Brady Statistic AP VS Percent: 0.07 %
Brady Statistic AS VP Percent: 62.99 %
Brady Statistic AS VS Percent: 4.22 %
Brady Statistic RA Percent Paced: 32.72 %
Brady Statistic RV Percent Paced: 95.7 %
Date Time Interrogation Session: 20231103051721
Implantable Lead Connection Status: 753985
Implantable Lead Connection Status: 753985
Implantable Lead Implant Date: 20230802
Implantable Lead Implant Date: 20230802
Implantable Lead Location: 753859
Implantable Lead Location: 753860
Implantable Lead Model: 3830
Implantable Lead Model: 5076
Implantable Pulse Generator Implant Date: 20230802
Lead Channel Impedance Value: 342 Ohm
Lead Channel Impedance Value: 399 Ohm
Lead Channel Impedance Value: 494 Ohm
Lead Channel Impedance Value: 513 Ohm
Lead Channel Pacing Threshold Amplitude: 0.625 V
Lead Channel Pacing Threshold Amplitude: 0.75 V
Lead Channel Pacing Threshold Pulse Width: 0.4 ms
Lead Channel Pacing Threshold Pulse Width: 0.4 ms
Lead Channel Sensing Intrinsic Amplitude: 2.5 mV
Lead Channel Sensing Intrinsic Amplitude: 2.5 mV
Lead Channel Sensing Intrinsic Amplitude: 31.625 mV
Lead Channel Sensing Intrinsic Amplitude: 31.625 mV
Lead Channel Setting Pacing Amplitude: 1.5 V
Lead Channel Setting Pacing Amplitude: 2 V
Lead Channel Setting Pacing Pulse Width: 0.4 ms
Lead Channel Setting Sensing Sensitivity: 1.2 mV
Zone Setting Status: 755011

## 2022-07-01 ENCOUNTER — Encounter: Payer: Self-pay | Admitting: Internal Medicine

## 2022-07-01 ENCOUNTER — Ambulatory Visit: Payer: Medicare Other | Attending: Internal Medicine | Admitting: Internal Medicine

## 2022-07-01 VITALS — BP 152/70 | HR 70 | Ht 69.0 in | Wt 210.0 lb

## 2022-07-01 DIAGNOSIS — R55 Syncope and collapse: Secondary | ICD-10-CM | POA: Insufficient documentation

## 2022-07-01 DIAGNOSIS — Z950 Presence of cardiac pacemaker: Secondary | ICD-10-CM | POA: Insufficient documentation

## 2022-07-01 DIAGNOSIS — I442 Atrioventricular block, complete: Secondary | ICD-10-CM | POA: Insufficient documentation

## 2022-07-01 DIAGNOSIS — R001 Bradycardia, unspecified: Secondary | ICD-10-CM | POA: Diagnosis present

## 2022-07-01 MED ORDER — CARVEDILOL 12.5 MG PO TABS: 12.5000 mg | ORAL_TABLET | Freq: Two times a day (BID) | ORAL | 3 refills | Status: DC

## 2022-07-01 NOTE — Patient Instructions (Addendum)
Medication Instructions:  Your physician has recommended you make the following change in your medication: STOP TAKING:  AMLODIPINE  START TAKING: CARVEDILOL 12.5 mg  YOU WILL:  Take 1 tablet ( 12.5 mg ) by mouth twice daily.    Lab Work: None ordered.  If you have labs (blood work) drawn today and your tests are completely normal, you will receive your results only by: [place] (if you have MyChart) OR A paper copy in the mail If you have any lab test that is abnormal or we need to change your treatment, we will call you to review the results.  Testing/Procedures: None ordered.  Follow-Up: At [facility], you and your health needs are our priority.  As part of our continuing mission to provide you with exceptional heart care, we have created designated Provider Care Teams.  These Care Teams include your primary Cardiologist (physician) and Advanced Practice Providers (APPs -  Physician Assistants and Nurse Practitioners) who all work together to provide you with the care you need, when you need it.  We recommend signing up for the patient portal called "MyChart".  Sign up information is provided on this After Visit Summary.  MyChart is used to connect with patients for Virtual Visits (Telemedicine).  Patients are able to view lab/test results, encounter notes, upcoming appointments, etc.  Non-urgent messages can be sent to your provider as well.   To learn more about what you can do with MyChart, go to [email or web address].    Your next appointment:   1 year(s)  The format for your next appointment:   In Person  Provider:   Cristopher Peru, MD{or one of the following Advanced Practice Providers on your designated Care Team:   Tommye Standard, [place] Legrand Como "Jonni Sanger" Chalmers Cater, [place]  Remote monitoring is used to monitor your Pacemaker from home. This monitoring reduces the number of office visits required to check your device to one time per year. It allows [place] to keep an eye on the  functioning of your device to ensure it is working properly. You are scheduled for a device check from home on 09/26/22. You may send your transmission at any time that day. If you have a wireless device, the transmission will be sent automatically. After your physician reviews your transmission, you will receive a postcard with your next transmission date.  Carvedilol Tablets What is this medication? CARVEDILOL (KAR ve dil ol) treats high blood pressure and heart failure. It may also be used to prevent further damage after a heart attack. It works by lowering your blood pressure and heart rate, making it easier for your heart to pump blood to the rest of your body. It belongs to a group of medications called beta blockers. This medicine may be used for other purposes; ask your health care provider or pharmacist if you have questions. COMMON BRAND NAME(S): Coreg What should I tell my care team before I take this medication? They need to know if you have any of these conditions: Circulation problems Diabetes History of heart attack or heart disease Liver disease Lung or breathing disease, such as asthma Pheochromocytoma Slow or irregular heartbeat Thyroid disease An unusual or allergic reaction to carvedilol, other medications, foods, dyes, or preservatives Pregnant or trying to get pregnant Breastfeeding How should I use this medication? Take this medication by mouth. Take it as directed on the prescription label at the same time every day. Take it with food. Keep taking it unless your care team tells you to stop.  Talk to your care team about the use of this medication in children. Special care may be needed. Overdosage: If you think you have taken too much of this medicine contact a poison control center or emergency room at once. NOTE: This medicine is only for you. Do not share this medicine with others. What if I miss a dose? If you miss a dose, take it as soon as you can. If it is almost  time for your next dose, take only that dose. Do not take double or extra doses. What may interact with this medication? This medication may interact with the following: Certain medications for blood pressure, heart disease, irregular heartbeat Certain medications for depression, such as fluoxetine or paroxetine Certain medications for diabetes, such as glipizide or glyburide Cimetidine Clonidine Cyclosporine Digoxin MAOIs, such as Carbex, Eldepryl, Marplan, Nardil, and Parnate Reserpine Rifampin This list may not describe all possible interactions. Give your health care provider a list of all the medicines, herbs, non-prescription drugs, or dietary supplements you use. Also tell them if you smoke, drink alcohol, or use illegal drugs. Some items may interact with your medicine. What should I watch for while using this medication? Visit your care team for regular checks on your progress. Check your blood pressure as directed. Know what your blood pressure should be and when to contact your care team. Do not treat yourself for coughs, colds, or pain while you are using this medication without asking your care team for advice. Some medications may increase your blood pressure. This medication may affect your coordination, reaction time, or judgment. Do not drive or operate machinery until you know how this medication affects you. Sit up or stand slowly to reduce the risk of dizzy or fainting spells. Drinking alcohol with this medication can increase the risk of these side effects. This medication may increase blood sugar. Ask your care team if changes in diet or medications are needed if you have diabetes. If you are going to need surgery or another procedure, tell your care team that you are using this medication. What side effects may I notice from receiving this medication? Side effects that you should report to your care team as soon as possible: Allergic reactions--skin rash, itching, hives,  swelling of the face, lips, tongue, or throat Heart failure--shortness of breath, swelling of the ankles, feet, or hands, sudden weight gain, unusual weakness or fatigue Low blood pressure--dizziness, feeling faint or lightheaded, blurry vision Raynaud's--cool, numb, or painful fingers or toes that may change color from pale, to blue, to red Slow heartbeat--dizziness, feeling faint or lightheaded, confusion, trouble breathing, unusual weakness or fatigue Worsening mood, feelings of depression Side effects that usually do not require medical attention (report to your care team if they continue or are bothersome): Change in sex drive or performance Diarrhea Dizziness Fatigue Headache This list may not describe all possible side effects. Call your doctor for medical advice about side effects. You may report side effects to FDA at 1-800-FDA-1088. Where should I keep my medication? Keep out of the reach of children and pets. Store at room temperature between 20 and 25 degrees C (68 and 77 degrees F). Protect from moisture. Keep the container tightly closed. Throw away any unused medication after the expiration date. NOTE: This sheet is a summary. It may not cover all possible information. If you have questions about this medicine, talk to your doctor, pharmacist, or health care provider.  2023 Elsevier/Gold Standard (2020-10-05 00:00:00)

## 2022-07-01 NOTE — Progress Notes (Signed)
HPI  No Known Allergies   [name] returns today for followup. He is a pleasant 86 yo man with a h/o remote syncope who underwent ILR insertion over 2 years ago. The patient has had an episode of syncope which correlates with an 8 second pause. He does not think he actually passed out but does admit to "going down." He denies chest pain.  He underwent insertion of a DDD PM about 3 months ago and has done well in the interim.     Past Medical History:  Diagnosis Date   Blood clotting disorder ([place])    hx blood clot in lung   Hypertension    Pulmonary embolism ([facility])    RBBB     ROS:   All systems reviewed and negative except as noted in the HPI.   Past Surgical History:  Procedure Laterality Date   LOOP RECORDER INSERTION N/A 09/22/2018   Procedure: LOOP RECORDER INSERTION;  Surgeon: Kristopher Lance, MD;  Location: [place] [facility];  Service: Cardiovascular;  Laterality: N/A;   LOOP RECORDER REMOVAL N/A 03/26/2022   Procedure: LOOP RECORDER REMOVAL;  Surgeon: Kristopher Lance, MD;  Location: [place] [facility];  Service: Cardiovascular;  Laterality: N/A;   PACEMAKER IMPLANT N/A 03/26/2022   Procedure: PACEMAKER IMPLANT;  Surgeon: Kristopher Lance, MD;  Location: [facility] [facility] [facility];  Service: Cardiovascular;  Laterality: N/A;     History reviewed. No pertinent family history.   Social History   Socioeconomic History   Marital status: Married    Spouse name: Not on file   Number of children: 2   Years of education: Not on file   Highest education level: 12th grade  Occupational History   Not on file  Tobacco Use   Smoking status: Former   Smokeless tobacco: Never  Vaping Use   Vaping Use: Never used  Substance and Sexual Activity   Alcohol use: Yes    Comment: 1 glass of wine each night with dinner   Drug use: Never   Sexual activity: Not on file  Other Topics Concern   Not on file  Social History Narrative   Pt lives in 2 story home with his wife   Has  2 adult sons   High school graduate   Retired Librarian, academic for [employer] Determinants of Radio broadcast assistant Strain: Not on file  Food Insecurity: Not on file  Transportation Needs: Not on file  Physical Activity: Not on file  Stress: Not on file  Social Connections: Not on file  Intimate Partner Violence: Not on file     BP (!) 152/70   Pulse 70   Ht 5\' 9"  (1.753 m)   Wt 210 lb (95.3 kg)   SpO2 94%   BMI 31.01 kg/m   Physical Exam:  Well appearing NAD HEENT: Unremarkable Neck:  No JVD, no thyromegally Lymphatics:  No adenopathy Back:  No CVA tenderness Lungs:  Clear HEART:  Regular rate rhythm, no murmurs, no rubs, no clicks Abd:  soft, positive bowel sounds, no organomegally, no rebound, no guarding Ext:  2 plus pulses, no edema, no cyanosis, no clubbing Skin:  No rashes no nodules Neuro:  CN II through XII intact, motor grossly intact  EKG  DEVICE  Normal device function.  See PaceArt for details.   Assess/Plan: Syncope - he has not had any since his PPM insertion.  PPM - his medtronic DDD PM is  working normally. HTN - his bp is not well controlled. He will uptitrate his coreg and stop the norvasc. Coags - he has not had any bleeding.   Kristopher Gowda Dymond Gutt,MD

## 2022-07-02 LAB — CUP PACEART INCLINIC DEVICE CHECK
Battery Remaining Longevity: 151 mo
Battery Voltage: 3.2 V
Brady Statistic AP VP Percent: 35.65 %
Brady Statistic AP VS Percent: 0.08 %
Brady Statistic AS VP Percent: 60.77 %
Brady Statistic AS VS Percent: 3.5 %
Brady Statistic RA Percent Paced: 35.66 %
Brady Statistic RV Percent Paced: 96.42 %
Date Time Interrogation Session: 20231107155900
Implantable Lead Connection Status: 753985
Implantable Lead Connection Status: 753985
Implantable Lead Implant Date: 20230802
Implantable Lead Implant Date: 20230802
Implantable Lead Location: 753859
Implantable Lead Location: 753860
Implantable Lead Model: 3830
Implantable Lead Model: 5076
Implantable Pulse Generator Implant Date: 20230802
Lead Channel Impedance Value: 380 Ohm
Lead Channel Impedance Value: 418 Ohm
Lead Channel Impedance Value: 551 Ohm
Lead Channel Impedance Value: 589 Ohm
Lead Channel Pacing Threshold Amplitude: 0.625 V
Lead Channel Pacing Threshold Amplitude: 0.75 V
Lead Channel Pacing Threshold Pulse Width: 0.4 ms
Lead Channel Pacing Threshold Pulse Width: 0.4 ms
Lead Channel Sensing Intrinsic Amplitude: 2.5 mV
Lead Channel Sensing Intrinsic Amplitude: 2.5 mV
Lead Channel Sensing Intrinsic Amplitude: 31.625 mV
Lead Channel Sensing Intrinsic Amplitude: 31.625 mV
Lead Channel Setting Pacing Amplitude: 1.5 V
Lead Channel Setting Pacing Amplitude: 2 V
Lead Channel Setting Pacing Pulse Width: 0.4 ms
Lead Channel Setting Sensing Sensitivity: 1.2 mV
Zone Setting Status: 755011

## 2022-07-07 NOTE — Progress Notes (Signed)
Remote pacemaker transmission.   

## 2022-08-12 ENCOUNTER — Encounter: Payer: Medicare Other | Admitting: Internal Medicine

## 2022-09-11 ENCOUNTER — Other Ambulatory Visit (HOSPITAL_COMMUNITY): Payer: Self-pay | Admitting: Internal Medicine

## 2022-09-11 DIAGNOSIS — M5416 Radiculopathy, lumbar region: Secondary | ICD-10-CM

## 2022-09-11 DIAGNOSIS — G8929 Other chronic pain: Secondary | ICD-10-CM

## 2022-09-26 ENCOUNTER — Ambulatory Visit: Payer: Medicare Other

## 2022-09-26 DIAGNOSIS — I442 Atrioventricular block, complete: Secondary | ICD-10-CM

## 2022-09-26 LAB — CUP PACEART REMOTE DEVICE CHECK
Battery Remaining Longevity: 147 mo
Battery Voltage: 3.16 V
Brady Statistic AP VP Percent: 37.84 %
Brady Statistic AP VS Percent: 0.05 %
Brady Statistic AS VP Percent: 61.26 %
Brady Statistic AS VS Percent: 0.86 %
Brady Statistic RA Percent Paced: 37.84 %
Brady Statistic RV Percent Paced: 99.1 %
Date Time Interrogation Session: 20240201215654
Implantable Lead Connection Status: 753985
Implantable Lead Connection Status: 753985
Implantable Lead Implant Date: 20230802
Implantable Lead Implant Date: 20230802
Implantable Lead Location: 753859
Implantable Lead Location: 753860
Implantable Lead Model: 3830
Implantable Lead Model: 5076
Implantable Pulse Generator Implant Date: 20230802
Lead Channel Impedance Value: 361 Ohm
Lead Channel Impedance Value: 380 Ohm
Lead Channel Impedance Value: 513 Ohm
Lead Channel Impedance Value: 513 Ohm
Lead Channel Pacing Threshold Amplitude: 0.75 V
Lead Channel Pacing Threshold Amplitude: 0.75 V
Lead Channel Pacing Threshold Pulse Width: 0.4 ms
Lead Channel Pacing Threshold Pulse Width: 0.4 ms
Lead Channel Sensing Intrinsic Amplitude: 2.5 mV
Lead Channel Sensing Intrinsic Amplitude: 2.5 mV
Lead Channel Sensing Intrinsic Amplitude: 31.625 mV
Lead Channel Sensing Intrinsic Amplitude: 31.625 mV
Lead Channel Setting Pacing Amplitude: 1.5 V
Lead Channel Setting Pacing Amplitude: 2 V
Lead Channel Setting Pacing Pulse Width: 0.4 ms
Lead Channel Setting Sensing Sensitivity: 1.2 mV
Zone Setting Status: 755011

## 2022-10-15 NOTE — Progress Notes (Signed)
Remote pacemaker transmission.   

## 2022-10-21 ENCOUNTER — Ambulatory Visit (HOSPITAL_COMMUNITY)
Admission: RE | Admit: 2022-10-21 | Discharge: 2022-10-21 | Disposition: A | Discharge status: Home / Self Care | Payer: Medicare Other | Source: Ambulatory Visit | Attending: Internal Medicine | Admitting: Internal Medicine

## 2022-10-21 DIAGNOSIS — G8929 Other chronic pain: Secondary | ICD-10-CM | POA: Diagnosis present

## 2022-10-21 DIAGNOSIS — M5416 Radiculopathy, lumbar region: Secondary | ICD-10-CM

## 2022-10-21 DIAGNOSIS — M5441 Lumbago with sciatica, right side: Secondary | ICD-10-CM | POA: Insufficient documentation

## 2022-10-21 NOTE — Progress Notes (Addendum)
Informed of MRI for today.   Device system confirmed to be MRI conditional, with implant date > 6 weeks ago, and no evidence of abandoned or epicardial leads in review of most recent CXR Interrogation from today reviewed, pt is currently AS-VP at ~ 90 bpm. Currently, HR 68-80 per staff  Change device settings for MRI to DOO 95 bpm   Tachy-therapies to off if applicable.  Program device back to pre-MRI settings after completion of exam.  Annamaria Helling  10/21/2022 1:04 PM

## 2022-10-21 NOTE — Progress Notes (Signed)
Per order, Changed device settings for MRI to DOO 95 bpm  Tachy-therapies to off if applicable.  Program device back to pre-MRI settings after completion of exam.

## 2022-12-26 ENCOUNTER — Ambulatory Visit (INDEPENDENT_AMBULATORY_CARE_PROVIDER_SITE_OTHER): Payer: Medicare Other

## 2022-12-26 DIAGNOSIS — I442 Atrioventricular block, complete: Secondary | ICD-10-CM | POA: Diagnosis not present

## 2022-12-28 LAB — CUP PACEART REMOTE DEVICE CHECK
Battery Remaining Longevity: 144 mo
Battery Voltage: 3.11 V
Brady Statistic AP VP Percent: 30.81 %
Brady Statistic AP VS Percent: 0.02 %
Brady Statistic AS VP Percent: 68.69 %
Brady Statistic AS VS Percent: 0.48 %
Brady Statistic RA Percent Paced: 30.8 %
Brady Statistic RV Percent Paced: 99.5 %
Date Time Interrogation Session: 20240503163539
Implantable Lead Connection Status: 753985
Implantable Lead Connection Status: 753985
Implantable Lead Implant Date: 20230802
Implantable Lead Implant Date: 20230802
Implantable Lead Location: 753859
Implantable Lead Location: 753860
Implantable Lead Model: 3830
Implantable Lead Model: 5076
Implantable Pulse Generator Implant Date: 20230802
Lead Channel Impedance Value: 342 Ohm
Lead Channel Impedance Value: 399 Ohm
Lead Channel Impedance Value: 513 Ohm
Lead Channel Impedance Value: 532 Ohm
Lead Channel Pacing Threshold Amplitude: 0.75 V
Lead Channel Pacing Threshold Amplitude: 0.75 V
Lead Channel Pacing Threshold Pulse Width: 0.4 ms
Lead Channel Pacing Threshold Pulse Width: 0.4 ms
Lead Channel Sensing Intrinsic Amplitude: 2.25 mV
Lead Channel Sensing Intrinsic Amplitude: 2.25 mV
Lead Channel Sensing Intrinsic Amplitude: 31.625 mV
Lead Channel Sensing Intrinsic Amplitude: 31.625 mV
Lead Channel Setting Pacing Amplitude: 1.5 V
Lead Channel Setting Pacing Amplitude: 2 V
Lead Channel Setting Pacing Pulse Width: 0.4 ms
Lead Channel Setting Sensing Sensitivity: 1.2 mV
Zone Setting Status: 755011

## 2023-01-14 NOTE — Progress Notes (Signed)
Remote pacemaker transmission.   

## 2023-03-19 ENCOUNTER — Other Ambulatory Visit: Payer: Self-pay | Admitting: Internal Medicine

## 2023-03-27 ENCOUNTER — Ambulatory Visit (INDEPENDENT_AMBULATORY_CARE_PROVIDER_SITE_OTHER): Payer: Medicare Other

## 2023-03-27 DIAGNOSIS — I442 Atrioventricular block, complete: Secondary | ICD-10-CM | POA: Diagnosis not present

## 2023-03-27 LAB — CUP PACEART REMOTE DEVICE CHECK
Battery Remaining Longevity: 140 mo
Battery Voltage: 3.06 V
Brady Statistic AP VP Percent: 35.17 %
Brady Statistic AP VS Percent: 0.02 %
Brady Statistic AS VP Percent: 64.38 %
Brady Statistic AS VS Percent: 0.43 %
Brady Statistic RA Percent Paced: 35.16 %
Brady Statistic RV Percent Paced: 99.55 %
Date Time Interrogation Session: 20240802054155
Implantable Lead Connection Status: 753985
Implantable Lead Connection Status: 753985
Implantable Lead Implant Date: 20230802
Implantable Lead Implant Date: 20230802
Implantable Lead Location: 753859
Implantable Lead Location: 753860
Implantable Lead Model: 3830
Implantable Lead Model: 5076
Implantable Pulse Generator Implant Date: 20230802
Lead Channel Impedance Value: 342 Ohm
Lead Channel Impedance Value: 399 Ohm
Lead Channel Impedance Value: 437 Ohm
Lead Channel Impedance Value: 513 Ohm
Lead Channel Pacing Threshold Amplitude: 0.625 V
Lead Channel Pacing Threshold Amplitude: 0.75 V
Lead Channel Pacing Threshold Pulse Width: 0.4 ms
Lead Channel Pacing Threshold Pulse Width: 0.4 ms
Lead Channel Sensing Intrinsic Amplitude: 3.375 mV
Lead Channel Sensing Intrinsic Amplitude: 3.375 mV
Lead Channel Sensing Intrinsic Amplitude: 31.625 mV
Lead Channel Sensing Intrinsic Amplitude: 31.625 mV
Lead Channel Setting Pacing Amplitude: 1.5 V
Lead Channel Setting Pacing Amplitude: 2 V
Lead Channel Setting Pacing Pulse Width: 0.4 ms
Lead Channel Setting Sensing Sensitivity: 1.2 mV
Zone Setting Status: 755011

## 2023-04-06 NOTE — Progress Notes (Signed)
Remote pacemaker transmission.   

## 2023-06-26 ENCOUNTER — Ambulatory Visit (INDEPENDENT_AMBULATORY_CARE_PROVIDER_SITE_OTHER): Payer: Medicare Other

## 2023-06-26 DIAGNOSIS — I442 Atrioventricular block, complete: Secondary | ICD-10-CM | POA: Diagnosis not present

## 2023-06-26 LAB — CUP PACEART REMOTE DEVICE CHECK
Battery Remaining Longevity: 138 mo
Battery Voltage: 3.04 V
Brady Statistic AP VP Percent: 44.21 %
Brady Statistic AP VS Percent: 0.03 %
Brady Statistic AS VP Percent: 54.95 %
Brady Statistic AS VS Percent: 0.8 %
Brady Statistic RA Percent Paced: 44.23 %
Brady Statistic RV Percent Paced: 99.17 %
Date Time Interrogation Session: 20241031225909
Implantable Lead Connection Status: 753985
Implantable Lead Connection Status: 753985
Implantable Lead Implant Date: 20230802
Implantable Lead Implant Date: 20230802
Implantable Lead Location: 753859
Implantable Lead Location: 753860
Implantable Lead Model: 3830
Implantable Lead Model: 5076
Implantable Pulse Generator Implant Date: 20230802
Lead Channel Impedance Value: 342 Ohm
Lead Channel Impedance Value: 399 Ohm
Lead Channel Impedance Value: 494 Ohm
Lead Channel Impedance Value: 532 Ohm
Lead Channel Pacing Threshold Amplitude: 0.75 V
Lead Channel Pacing Threshold Amplitude: 0.75 V
Lead Channel Pacing Threshold Pulse Width: 0.4 ms
Lead Channel Pacing Threshold Pulse Width: 0.4 ms
Lead Channel Sensing Intrinsic Amplitude: 3.375 mV
Lead Channel Sensing Intrinsic Amplitude: 3.375 mV
Lead Channel Sensing Intrinsic Amplitude: 31.625 mV
Lead Channel Sensing Intrinsic Amplitude: 31.625 mV
Lead Channel Setting Pacing Amplitude: 1.5 V
Lead Channel Setting Pacing Amplitude: 2 V
Lead Channel Setting Pacing Pulse Width: 0.4 ms
Lead Channel Setting Sensing Sensitivity: 1.2 mV
Zone Setting Status: 755011

## 2023-07-08 NOTE — Progress Notes (Signed)
Remote pacemaker transmission.   

## 2023-07-21 ENCOUNTER — Ambulatory Visit: Payer: Medicare Other | Attending: Internal Medicine | Admitting: Internal Medicine

## 2023-07-21 ENCOUNTER — Encounter: Payer: Self-pay | Admitting: Internal Medicine

## 2023-07-21 VITALS — BP 144/78 | HR 60 | Ht 69.0 in | Wt 209.0 lb

## 2023-07-21 DIAGNOSIS — I442 Atrioventricular block, complete: Secondary | ICD-10-CM

## 2023-07-21 LAB — CUP PACEART INCLINIC DEVICE CHECK
Battery Remaining Longevity: 138 mo
Battery Voltage: 3.03 V
Brady Statistic AP VP Percent: 38.08 %
Brady Statistic AP VS Percent: 0.02 %
Brady Statistic AS VP Percent: 61.3 %
Brady Statistic AS VS Percent: 0.59 %
Brady Statistic RA Percent Paced: 38.08 %
Brady Statistic RV Percent Paced: 99.39 %
Date Time Interrogation Session: 20241126153019
Implantable Lead Connection Status: 753985
Implantable Lead Connection Status: 753985
Implantable Lead Implant Date: 20230802
Implantable Lead Implant Date: 20230802
Implantable Lead Location: 753859
Implantable Lead Location: 753860
Implantable Lead Model: 3830
Implantable Lead Model: 5076
Implantable Pulse Generator Implant Date: 20230802
Lead Channel Impedance Value: 380 Ohm
Lead Channel Impedance Value: 418 Ohm
Lead Channel Impedance Value: 475 Ohm
Lead Channel Impedance Value: 551 Ohm
Lead Channel Pacing Threshold Amplitude: 0.625 V
Lead Channel Pacing Threshold Amplitude: 0.75 V
Lead Channel Pacing Threshold Pulse Width: 0.4 ms
Lead Channel Pacing Threshold Pulse Width: 0.4 ms
Lead Channel Sensing Intrinsic Amplitude: 2.75 mV
Lead Channel Sensing Intrinsic Amplitude: 3.375 mV
Lead Channel Sensing Intrinsic Amplitude: 31.625 mV
Lead Channel Sensing Intrinsic Amplitude: 31.625 mV
Lead Channel Setting Pacing Amplitude: 1.5 V
Lead Channel Setting Pacing Amplitude: 2 V
Lead Channel Setting Pacing Pulse Width: 0.4 ms
Lead Channel Setting Sensing Sensitivity: 1.2 mV
Zone Setting Status: 755011

## 2023-07-21 NOTE — Patient Instructions (Signed)
Medication Instructions:  Your physician recommends that you continue on your current medications as directed. Please refer to the Current Medication list given to you today.  *If you need a refill on your cardiac medications before your next appointment, please call your pharmacy*  Lab Work: None ordered.  If you have labs (blood work) drawn today and your tests are completely normal, you will receive your results only by: MyChart Message (if you have MyChart) OR A paper copy in the mail If you have any lab test that is abnormal or we need to change your treatment, we will call you to review the results.  Testing/Procedures: None ordered.  Follow-Up: At [facility], you and your health needs are our priority.  As part of our continuing mission to provide you with exceptional heart care, we have created designated Provider Care Teams.  These Care Teams include your primary Cardiologist (physician) and Advanced Practice Providers (APPs -  Physician Assistants and Nurse Practitioners) who all work together to provide you with the care you need, when you need it.  We recommend signing up for the patient portal called "MyChart".  Sign up information is provided on this After Visit Summary.  MyChart is used to connect with patients for Virtual Visits (Telemedicine).  Patients are able to view lab/test results, encounter notes, upcoming appointments, etc.  Non-urgent messages can be sent to your provider as well.   To learn more about what you can do with MyChart, go to [email or web address].    Your next appointment:   1 year(s)  The format for your next appointment:   In Person  Provider:   Lewayne Bunting, MD{or one of the following Advanced Practice Providers on your designated Care Team:   Francis Dowse, [place] Casimiro Needle "Mardelle Matte" [place], [place] Earnest Rosier, NP  Remote monitoring is used to monitor your Pacemaker/ ICD from home. This monitoring reduces the number of office visits required  to check your device to one time per year. It allows [place] to keep an eye on the functioning of your device to ensure it is working properly.   Important Information About Sugar

## 2023-07-21 NOTE — Progress Notes (Signed)
HPI Kristopher Adams returns today for followup. He is a pleasant 87 yo man with a h/o remote syncope who underwent ILR insertion and found to have pauses and underwent PPM insertion over 2 years ago and has done well in the interim. He admits to moving slower. No syncope. No CHF symptoms.  No Known Allergies   Current Outpatient Medications  Medication Sig Dispense Refill   acetaminophen (TYLENOL) 500 MG tablet Take 500-1,000 mg by mouth every 6 (six) hours as needed (for pain.).     apixaban (ELIQUIS) 5 MG TABS tablet Take 5 mg by mouth 2 (two) times daily.     Biotin 1000 MCG tablet Take 1,000 mcg by mouth in the morning.     carvedilol (COREG) 12.5 MG tablet Take 1 tablet (12.5 mg total) by mouth 2 (two) times daily. 180 tablet 3   cholecalciferol (VITAMIN D3) 25 MCG (1000 UT) tablet Take 1,000 Units by mouth in the morning.     lisinopril (PRINIVIL,ZESTRIL) 20 MG tablet Take 20 mg by mouth 2 (two) times daily.      Magnesium 250 MG TABS Take 250 mg by mouth at bedtime.     Magnesium 500 MG TABS Take 500 mg by mouth in the morning.     Multiple Vitamin (MULTIVITAMIN WITH MINERALS) TABS tablet Take 1 tablet by mouth in the morning.     omeprazole (PRILOSEC) 20 MG capsule Take 20 mg by mouth in the morning.     Polyethyl Glycol-Propyl Glycol (SYSTANE) 0.4-0.3 % SOLN Place 1 drop into both eyes in the morning and at bedtime.     No current facility-administered medications for this visit.     Past Medical History:  Diagnosis Date   Blood clotting disorder (HCC)    hx blood clot in lung   Hypertension    Pulmonary embolism ([facility])    RBBB     ROS:   All systems reviewed and negative except as noted in the HPI.   Past Surgical History:  Procedure Laterality Date   LOOP RECORDER INSERTION N/A 09/22/2018   Procedure: LOOP RECORDER INSERTION;  Surgeon: Marinus Maw, MD;  Location: [facility];  Service: Cardiovascular;  Laterality: N/A;   LOOP RECORDER REMOVAL N/A  03/26/2022   Procedure: LOOP RECORDER REMOVAL;  Surgeon: Marinus Maw, MD;  Location: [facility];  Service: Cardiovascular;  Laterality: N/A;   PACEMAKER IMPLANT N/A 03/26/2022   Procedure: PACEMAKER IMPLANT;  Surgeon: Marinus Maw, MD;  Location: [facility];  Service: Cardiovascular;  Laterality: N/A;     History reviewed. No pertinent family history.   Social History   Socioeconomic History   Marital status: Married    Spouse name: Not on file   Number of children: 2   Years of education: Not on file   Highest education level: 12th grade  Occupational History   Not on file  Tobacco Use   Smoking status: Former   Smokeless tobacco: Never  Vaping Use   Vaping status: Never Used  Substance and Sexual Activity   Alcohol use: Yes    Comment: 1 glass of wine each night with dinner   Drug use: Never   Sexual activity: Not on file  Other Topics Concern   Not on file  Social History Narrative   Pt lives in 2 story home with his wife   Has 2 adult sons   High school graduate   Retired Merchandiser, retail for  [employer]    Social Determinants of Corporate investment banker Strain: Not on file  Food Insecurity: Not on file  Transportation Needs: Not on file  Physical Activity: Not on file  Stress: Not on file  Social Connections: Not on file  Intimate Partner Violence: Not on file     BP (!) 144/78   Pulse 60   Ht 5\' 9"  (1.753 m)   Wt 209 lb (94.8 kg)   SpO2 96%   BMI 30.86 kg/m   Physical Exam:  Well appearing NAD HEENT: Unremarkable Neck:  No JVD, no thyromegally Lymphatics:  No adenopathy Back:  No CVA tenderness Lungs:  Clear HEART:  Regular rate rhythm, no murmurs, no rubs, no clicks Abd:  soft, positive bowel sounds, no organomegally, no rebound, no guarding Ext:  2 plus pulses, no edema, no cyanosis, no clubbing Skin:  No rashes no nodules Neuro:  CN II through XII intact, motor grossly intact  EKG - nsr with AV pacing  DEVICE  Normal  device function.  See PaceArt for details.   Assess/Plan:  Syncope - he has not had any since his PPM insertion.  PPM - his medtronic DDD PM is working normally. HTN - his bp is not well controlled. He will uptitrate his coreg and stop the norvasc. Coags - he has not had any bleeding.    Kristopher Gowda Leler Brion,MD

## 2023-09-21 ENCOUNTER — Telehealth: Payer: Self-pay | Admitting: Internal Medicine

## 2023-09-21 MED ORDER — CARVEDILOL 12.5 MG PO TABS: 12.5000 mg | ORAL_TABLET | Freq: Two times a day (BID) | ORAL | 3 refills | Status: DC

## 2023-09-21 NOTE — Telephone Encounter (Signed)
*  STAT* If patient is at the pharmacy, call can be transferred to refill team.   1. Which medications need to be refilled? (please list name of each medication and dose if known) carvedilol (COREG) 12.5 MG tablet   2. Would you like to learn more about the convenience, safety, & potential cost savings by using the [facility]? No   3. Are you open to using the [facility] No   4. Which pharmacy/location (including street and city if local pharmacy) is medication to be sent to?[facility] - [place], [place] - [address]    5. Do they need a 30 day or 90 day supply? 90 Day Supply

## 2023-09-25 ENCOUNTER — Ambulatory Visit: Payer: Medicare Other

## 2023-09-25 DIAGNOSIS — I442 Atrioventricular block, complete: Secondary | ICD-10-CM

## 2023-10-30 NOTE — Progress Notes (Signed)
 Remote pacemaker transmission.

## 2023-12-10 ENCOUNTER — Encounter: Payer: Self-pay | Admitting: Internal Medicine

## 2023-12-10 ENCOUNTER — Ambulatory Visit: Payer: Medicare Other | Attending: Internal Medicine | Admitting: Internal Medicine

## 2023-12-10 VITALS — BP 148/74 | HR 65 | Ht 69.0 in | Wt 211.4 lb

## 2023-12-10 DIAGNOSIS — I442 Atrioventricular block, complete: Secondary | ICD-10-CM

## 2023-12-10 DIAGNOSIS — Z950 Presence of cardiac pacemaker: Secondary | ICD-10-CM

## 2023-12-10 LAB — CUP PACEART INCLINIC DEVICE CHECK
Battery Remaining Longevity: 133 mo
Battery Voltage: 3.02 V
Brady Statistic AP VP Percent: 43.41 %
Brady Statistic AP VS Percent: 0.03 %
Brady Statistic AS VP Percent: 55.99 %
Brady Statistic AS VS Percent: 0.57 %
Brady Statistic RA Percent Paced: 43.44 %
Brady Statistic RV Percent Paced: 99.4 %
Date Time Interrogation Session: 20250417151038
Implantable Lead Connection Status: 753985
Implantable Lead Connection Status: 753985
Implantable Lead Implant Date: 20230802
Implantable Lead Implant Date: 20230802
Implantable Lead Location: 753859
Implantable Lead Location: 753860
Implantable Lead Model: 3830
Implantable Lead Model: 5076
Implantable Pulse Generator Implant Date: 20230802
Lead Channel Impedance Value: 361 Ohm
Lead Channel Impedance Value: 418 Ohm
Lead Channel Impedance Value: 532 Ohm
Lead Channel Impedance Value: 608 Ohm
Lead Channel Pacing Threshold Amplitude: 0.625 V
Lead Channel Pacing Threshold Amplitude: 0.875 V
Lead Channel Pacing Threshold Pulse Width: 0.4 ms
Lead Channel Pacing Threshold Pulse Width: 0.4 ms
Lead Channel Sensing Intrinsic Amplitude: 2.125 mV
Lead Channel Sensing Intrinsic Amplitude: 2.75 mV
Lead Channel Sensing Intrinsic Amplitude: 31.625 mV
Lead Channel Sensing Intrinsic Amplitude: 31.625 mV
Lead Channel Setting Pacing Amplitude: 1.5 V
Lead Channel Setting Pacing Amplitude: 2 V
Lead Channel Setting Pacing Pulse Width: 0.4 ms
Lead Channel Setting Sensing Sensitivity: 1.2 mV
Zone Setting Status: 755011

## 2023-12-10 NOTE — Patient Instructions (Signed)

## 2023-12-10 NOTE — Progress Notes (Signed)
 HPI Mr. Migues returns today for followup. He is a pleasant 88 yo man with a h/o remote syncope who underwent ILR insertion and found to have pauses and underwent PPM insertion over 3 years ago and has done well in the interim. He admits to moving slower. No syncope. No CHF symptoms. He's had some problems with ringing in his left ear. Nothing found on CT scan. No Known Allergies   Current Outpatient Medications  Medication Sig Dispense Refill   acetaminophen (TYLENOL) 500 MG tablet Take 500-1,000 mg by mouth every 6 (six) hours as needed (for pain.).     apixaban (ELIQUIS) 5 MG TABS tablet Take 5 mg by mouth 2 (two) times daily.     Biotin 1000 MCG tablet Take 1,000 mcg by mouth in the morning.     carvedilol (COREG) 12.5 MG tablet Take 1 tablet (12.5 mg total) by mouth 2 (two) times daily. 180 tablet 3   cholecalciferol (VITAMIN D3) 25 MCG (1000 UT) tablet Take 1,000 Units by mouth in the morning.     lisinopril (PRINIVIL,ZESTRIL) 20 MG tablet Take 20 mg by mouth 2 (two) times daily.      Magnesium 250 MG TABS Take 250 mg by mouth at bedtime.     Magnesium 500 MG TABS Take 500 mg by mouth in the morning.     Multiple Vitamin (MULTIVITAMIN WITH MINERALS) TABS tablet Take 1 tablet by mouth in the morning.     omeprazole (PRILOSEC) 20 MG capsule Take 20 mg by mouth in the morning.     Polyethyl Glycol-Propyl Glycol (SYSTANE) 0.4-0.3 % SOLN Place 1 drop into both eyes in the morning and at bedtime.     No current facility-administered medications for this visit.     Past Medical History:  Diagnosis Date   Blood clotting disorder (HCC)    hx blood clot in lung   Hypertension    Pulmonary embolism (HCC)    RBBB     ROS:   All systems reviewed and negative except as noted in the HPI.   Past Surgical History:  Procedure Laterality Date   LOOP RECORDER INSERTION N/A 09/22/2018   Procedure: LOOP RECORDER INSERTION;  Surgeon: Marinus Maw, MD;  [facility]: [facility];   Service: Cardiovascular;  Laterality: N/A;   LOOP RECORDER REMOVAL N/A 03/26/2022   Procedure: LOOP RECORDER REMOVAL;  Surgeon: Marinus Maw, MD;  Location: [facility];  Service: Cardiovascular;  Laterality: N/A;   PACEMAKER IMPLANT N/A 03/26/2022   Procedure: PACEMAKER IMPLANT;  Surgeon: Marinus Maw, MD;  Location: [facility];  Service: Cardiovascular;  Laterality: N/A;     History reviewed. No pertinent family history.   Social History   Socioeconomic History   Marital status: Married    Spouse name: Not on file   Number of children: 2   Years of education: Not on file   Highest education level: 12th grade  Occupational History   Not on file  Tobacco Use   Smoking status: Former   Smokeless tobacco: Never  Vaping Use   Vaping status: Never Used  Substance and Sexual Activity   Alcohol use: Yes    Comment: 1 glass of wine each night with dinner   Drug use: Never   Sexual activity: Not on file  Other Topics Concern   Not on file  Social History Narrative   Pt lives in 2 story home with his wife  Has 2 adult sons   High school graduate   Retired Merchandiser, retail for [employer] Drivers of Corporate investment banker Strain: Not on [employer] Insecurity: Not on file  Transportation Needs: Not on file  Physical Activity: Not on file  Stress: Not on file  Social Connections: Not on file  Intimate Partner Violence: Not on file     BP (!) 148/74   Pulse 65   Ht 5\' 9"  (1.753 m)   Wt 211 lb 6.4 oz (95.9 kg)   SpO2 97%   BMI 31.22 kg/m   Physical Exam:  Well appearing NAD HEENT: Unremarkable Neck:  No JVD, no thyromegally Lymphatics:  No adenopathy Back:  No CVA tenderness Lungs:  Clear HEART:  Regular rate rhythm, no murmurs, no rubs, no clicks Abd:  soft, positive bowel sounds, no organomegally, no rebound, no guarding Ext:  2 plus pulses, no edema, no cyanosis, no clubbing Skin:  No rashes no nodules Neuro:  CN II through XII intact,  motor grossly intact   DEVICE  Normal device function.  See PaceArt for details.   Assess/Plan:  Syncope - he has not had any since his PPM insertion.  PPM - his medtronic DDD PM is working normally. HTN - his bp is reasonably well controlled. He will uptitrate his coreg and stop the norvasc. Coags - he has not had any bleeding.    Pete Brand Jamerson Vonbargen,MD

## 2023-12-25 ENCOUNTER — Ambulatory Visit (INDEPENDENT_AMBULATORY_CARE_PROVIDER_SITE_OTHER): Payer: Medicare Other

## 2023-12-25 DIAGNOSIS — I442 Atrioventricular block, complete: Secondary | ICD-10-CM | POA: Diagnosis not present

## 2023-12-25 LAB — CUP PACEART REMOTE DEVICE CHECK
Battery Remaining Longevity: 131 mo
Battery Voltage: 3.02 V
Brady Statistic AP VP Percent: 49.8 %
Brady Statistic AP VS Percent: 0.05 %
Brady Statistic AS VP Percent: 47.24 %
Brady Statistic AS VS Percent: 2.91 %
Brady Statistic RA Percent Paced: 50.99 %
Brady Statistic RV Percent Paced: 97.04 %
Date Time Interrogation Session: 20250502144426
Implantable Lead Connection Status: 753985
Implantable Lead Connection Status: 753985
Implantable Lead Implant Date: 20230802
Implantable Lead Implant Date: 20230802
Implantable Lead Location: 753859
Implantable Lead Location: 753860
Implantable Lead Model: 3830
Implantable Lead Model: 5076
Implantable Pulse Generator Implant Date: 20230802
Lead Channel Impedance Value: 342 Ohm
Lead Channel Impedance Value: 399 Ohm
Lead Channel Impedance Value: 494 Ohm
Lead Channel Impedance Value: 513 Ohm
Lead Channel Pacing Threshold Amplitude: 0.625 V
Lead Channel Pacing Threshold Amplitude: 0.75 V
Lead Channel Pacing Threshold Pulse Width: 0.4 ms
Lead Channel Pacing Threshold Pulse Width: 0.4 ms
Lead Channel Sensing Intrinsic Amplitude: 2.875 mV
Lead Channel Sensing Intrinsic Amplitude: 2.875 mV
Lead Channel Sensing Intrinsic Amplitude: 31.625 mV
Lead Channel Sensing Intrinsic Amplitude: 31.625 mV
Lead Channel Setting Pacing Amplitude: 1.5 V
Lead Channel Setting Pacing Amplitude: 2 V
Lead Channel Setting Pacing Pulse Width: 0.4 ms
Lead Channel Setting Sensing Sensitivity: 1.2 mV
Zone Setting Status: 755011

## 2024-02-03 NOTE — Progress Notes (Signed)
 Remote pacemaker transmission.

## 2024-03-25 ENCOUNTER — Ambulatory Visit (INDEPENDENT_AMBULATORY_CARE_PROVIDER_SITE_OTHER): Payer: Medicare Other

## 2024-03-25 DIAGNOSIS — I442 Atrioventricular block, complete: Secondary | ICD-10-CM

## 2024-03-25 LAB — CUP PACEART REMOTE DEVICE CHECK
Battery Remaining Longevity: 128 mo
Battery Voltage: 3.02 V
Brady Statistic AP VP Percent: 47.28 %
Brady Statistic AP VS Percent: 0.06 %
Brady Statistic AS VP Percent: 45.97 %
Brady Statistic AS VS Percent: 6.69 %
Brady Statistic RA Percent Paced: 50.76 %
Brady Statistic RV Percent Paced: 93.25 %
Date Time Interrogation Session: 20250801050235
Implantable Lead Connection Status: 753985
Implantable Lead Connection Status: 753985
Implantable Lead Implant Date: 20230802
Implantable Lead Implant Date: 20230802
Implantable Lead Location: 753859
Implantable Lead Location: 753860
Implantable Lead Model: 3830
Implantable Lead Model: 5076
Implantable Pulse Generator Implant Date: 20230802
Lead Channel Impedance Value: 342 Ohm
Lead Channel Impedance Value: 418 Ohm
Lead Channel Impedance Value: 475 Ohm
Lead Channel Impedance Value: 513 Ohm
Lead Channel Pacing Threshold Amplitude: 0.625 V
Lead Channel Pacing Threshold Amplitude: 0.625 V
Lead Channel Pacing Threshold Pulse Width: 0.4 ms
Lead Channel Pacing Threshold Pulse Width: 0.4 ms
Lead Channel Sensing Intrinsic Amplitude: 2.375 mV
Lead Channel Sensing Intrinsic Amplitude: 2.375 mV
Lead Channel Sensing Intrinsic Amplitude: 20.75 mV
Lead Channel Sensing Intrinsic Amplitude: 20.75 mV
Lead Channel Setting Pacing Amplitude: 1.5 V
Lead Channel Setting Pacing Amplitude: 2 V
Lead Channel Setting Pacing Pulse Width: 0.4 ms
Lead Channel Setting Sensing Sensitivity: 1.2 mV
Zone Setting Status: 755011

## 2024-03-26 ENCOUNTER — Ambulatory Visit: Payer: Self-pay | Admitting: Internal Medicine

## 2024-05-18 NOTE — Progress Notes (Signed)
 Remote PPM Transmission

## 2024-06-24 ENCOUNTER — Ambulatory Visit: Payer: Medicare Other

## 2024-06-24 DIAGNOSIS — I442 Atrioventricular block, complete: Secondary | ICD-10-CM | POA: Diagnosis not present

## 2024-06-25 LAB — CUP PACEART REMOTE DEVICE CHECK
Battery Remaining Longevity: 126 mo
Battery Voltage: 3.01 V
Brady Statistic AP VP Percent: 38.05 %
Brady Statistic AP VS Percent: 0.05 %
Brady Statistic AS VP Percent: 59.41 %
Brady Statistic AS VS Percent: 2.49 %
Brady Statistic RA Percent Paced: 38.54 %
Brady Statistic RV Percent Paced: 97.46 %
Date Time Interrogation Session: 20251030221137
Implantable Lead Connection Status: 753985
Implantable Lead Connection Status: 753985
Implantable Lead Implant Date: 20230802
Implantable Lead Implant Date: 20230802
Implantable Lead Location: 753859
Implantable Lead Location: 753860
Implantable Lead Model: 3830
Implantable Lead Model: 5076
Implantable Pulse Generator Implant Date: 20230802
Lead Channel Impedance Value: 380 Ohm
Lead Channel Impedance Value: 418 Ohm
Lead Channel Impedance Value: 513 Ohm
Lead Channel Impedance Value: 532 Ohm
Lead Channel Pacing Threshold Amplitude: 0.625 V
Lead Channel Pacing Threshold Amplitude: 0.75 V
Lead Channel Pacing Threshold Pulse Width: 0.4 ms
Lead Channel Pacing Threshold Pulse Width: 0.4 ms
Lead Channel Sensing Intrinsic Amplitude: 27.875 mV
Lead Channel Sensing Intrinsic Amplitude: 27.875 mV
Lead Channel Sensing Intrinsic Amplitude: 3.125 mV
Lead Channel Sensing Intrinsic Amplitude: 3.125 mV
Lead Channel Setting Pacing Amplitude: 1.5 V
Lead Channel Setting Pacing Amplitude: 2 V
Lead Channel Setting Pacing Pulse Width: 0.4 ms
Lead Channel Setting Sensing Sensitivity: 1.2 mV
Zone Setting Status: 755011

## 2024-06-29 NOTE — Progress Notes (Signed)
 Remote PPM Transmission

## 2024-06-30 ENCOUNTER — Ambulatory Visit: Payer: Self-pay | Admitting: Internal Medicine

## 2024-07-18 ENCOUNTER — Other Ambulatory Visit: Payer: Self-pay | Admitting: Internal Medicine

## 2024-09-23 ENCOUNTER — Ambulatory Visit: Payer: Medicare Other

## 2024-09-23 DIAGNOSIS — I442 Atrioventricular block, complete: Secondary | ICD-10-CM

## 2024-09-23 LAB — CUP PACEART REMOTE DEVICE CHECK
Battery Remaining Longevity: 122 mo
Battery Voltage: 3.01 V
Brady Statistic AP VP Percent: 39.44 %
Brady Statistic AP VS Percent: 0.01 %
Brady Statistic AS VP Percent: 58.99 %
Brady Statistic AS VS Percent: 1.56 %
Brady Statistic RA Percent Paced: 40.15 %
Brady Statistic RV Percent Paced: 98.43 %
Date Time Interrogation Session: 20260129183542
Implantable Lead Connection Status: 753985
Implantable Lead Connection Status: 753985
Implantable Lead Implant Date: 20230802
Implantable Lead Implant Date: 20230802
Implantable Lead Location: 753859
Implantable Lead Location: 753860
Implantable Lead Model: 3830
Implantable Lead Model: 5076
Implantable Pulse Generator Implant Date: 20230802
Lead Channel Impedance Value: 342 Ohm
Lead Channel Impedance Value: 418 Ohm
Lead Channel Impedance Value: 456 Ohm
Lead Channel Impedance Value: 532 Ohm
Lead Channel Pacing Threshold Amplitude: 0.625 V
Lead Channel Pacing Threshold Amplitude: 0.625 V
Lead Channel Pacing Threshold Pulse Width: 0.4 ms
Lead Channel Pacing Threshold Pulse Width: 0.4 ms
Lead Channel Sensing Intrinsic Amplitude: 2.375 mV
Lead Channel Sensing Intrinsic Amplitude: 2.375 mV
Lead Channel Sensing Intrinsic Amplitude: 31.625 mV
Lead Channel Sensing Intrinsic Amplitude: 31.625 mV
Lead Channel Setting Pacing Amplitude: 1.5 V
Lead Channel Setting Pacing Amplitude: 2 V
Lead Channel Setting Pacing Pulse Width: 0.4 ms
Lead Channel Setting Sensing Sensitivity: 1.2 mV
Zone Setting Status: 755011

## 2024-09-27 NOTE — Progress Notes (Signed)
 Remote PPM Transmission

## 2024-12-13 ENCOUNTER — Ambulatory Visit: Admitting: Cardiovascular Disease

## 2024-12-23 ENCOUNTER — Ambulatory Visit: Payer: Medicare Other
